# Patient Record
Sex: Male | Born: 1950 | Race: Black or African American | Hispanic: No | Marital: Married | Smoking: Current every day smoker
Health system: Southern US, Community
[De-identification: ages and names within clinical notes are randomized; demographics above are authoritative.]

## PROBLEM LIST (undated history)

## (undated) ENCOUNTER — Emergency Department (HOSPITAL_COMMUNITY): Admission: EM | Payer: Medicare Other

## (undated) DIAGNOSIS — F329 Major depressive disorder, single episode, unspecified: Secondary | ICD-10-CM

## (undated) DIAGNOSIS — F1911 Other psychoactive substance abuse, in remission: Secondary | ICD-10-CM

## (undated) DIAGNOSIS — R6250 Unspecified lack of expected normal physiological development in childhood: Secondary | ICD-10-CM

## (undated) DIAGNOSIS — F32A Depression, unspecified: Secondary | ICD-10-CM

## (undated) DIAGNOSIS — R32 Unspecified urinary incontinence: Secondary | ICD-10-CM

## (undated) DIAGNOSIS — F419 Anxiety disorder, unspecified: Secondary | ICD-10-CM

## (undated) DIAGNOSIS — W3400XA Accidental discharge from unspecified firearms or gun, initial encounter: Secondary | ICD-10-CM

## (undated) DIAGNOSIS — Y249XXA Unspecified firearm discharge, undetermined intent, initial encounter: Secondary | ICD-10-CM

## (undated) HISTORY — DX: Unspecified urinary incontinence: R32

## (undated) HISTORY — PX: LEG SURGERY: SHX1003

## (undated) HISTORY — DX: Unspecified firearm discharge, undetermined intent, initial encounter: Y24.9XXA

## (undated) HISTORY — PX: BACK SURGERY: SHX140

## (undated) HISTORY — DX: Accidental discharge from unspecified firearms or gun, initial encounter: W34.00XA

## (undated) HISTORY — PX: ABDOMINAL AORTIC ANEURYSM REPAIR: SUR1152

---

## 2005-10-09 ENCOUNTER — Other Ambulatory Visit: Payer: Self-pay

## 2005-10-09 ENCOUNTER — Emergency Department: Payer: Self-pay | Admitting: Emergency Medicine

## 2010-11-22 ENCOUNTER — Observation Stay: Payer: Self-pay | Admitting: Internal Medicine

## 2011-12-13 ENCOUNTER — Observation Stay: Payer: Self-pay | Admitting: Internal Medicine

## 2011-12-13 LAB — CBC WITH DIFFERENTIAL/PLATELET
Eosinophil #: 0.2 10*3/uL (ref 0.0–0.7)
Eosinophil %: 5.3 %
HGB: 13.5 g/dL (ref 13.0–18.0)
Lymphocyte %: 38.3 %
MCH: 31.2 pg (ref 26.0–34.0)
MCHC: 32.6 g/dL (ref 32.0–36.0)
Monocyte #: 0.5 x10 3/mm (ref 0.2–1.0)
Neutrophil %: 42.4 %
Platelet: 187 10*3/uL (ref 150–440)
RBC: 4.33 10*6/uL — ABNORMAL LOW (ref 4.40–5.90)
WBC: 4.1 10*3/uL (ref 3.8–10.6)

## 2011-12-13 LAB — COMPREHENSIVE METABOLIC PANEL
Albumin: 3.5 g/dL (ref 3.4–5.0)
Alkaline Phosphatase: 72 U/L (ref 50–136)
BUN: 9 mg/dL (ref 7–18)
Bilirubin,Total: 0.3 mg/dL (ref 0.2–1.0)
Co2: 26 mmol/L (ref 21–32)
Creatinine: 1.05 mg/dL (ref 0.60–1.30)
Glucose: 125 mg/dL — ABNORMAL HIGH (ref 65–99)
Osmolality: 287 (ref 275–301)
Potassium: 3.8 mmol/L (ref 3.5–5.1)
SGPT (ALT): 18 U/L (ref 12–78)
Sodium: 144 mmol/L (ref 136–145)

## 2011-12-13 LAB — TROPONIN I: Troponin-I: <0.02 ng/mL

## 2011-12-14 LAB — LIPID PANEL
HDL Cholesterol: 47 mg/dL (ref 40–60)
VLDL Cholesterol, Calc: 14 mg/dL (ref 5–40)

## 2011-12-14 LAB — TROPONIN I: Troponin-I: <0.02 ng/mL

## 2012-05-29 ENCOUNTER — Emergency Department: Payer: Self-pay | Admitting: Emergency Medicine

## 2012-05-29 LAB — COMPREHENSIVE METABOLIC PANEL
Albumin: 3.8 g/dL (ref 3.4–5.0)
Anion Gap: 4 — ABNORMAL LOW (ref 7–16)
Chloride: 106 mmol/L (ref 98–107)
Co2: 30 mmol/L (ref 21–32)
EGFR (African American): >60
EGFR (Non-African Amer.): >60
Potassium: 4.2 mmol/L (ref 3.5–5.1)
SGOT(AST): 30 U/L (ref 15–37)
SGPT (ALT): 26 U/L (ref 12–78)
Total Protein: 7.3 g/dL (ref 6.4–8.2)

## 2012-05-29 LAB — CK TOTAL AND CKMB (NOT AT ARMC)
CK, Total: 459 U/L — ABNORMAL HIGH (ref 35–232)
CK-MB: 8.4 ng/mL — ABNORMAL HIGH (ref 0.5–3.6)

## 2012-05-29 LAB — CBC
HCT: 42.6 % (ref 40.0–52.0)
HGB: 14.4 g/dL (ref 13.0–18.0)
MCHC: 33.7 g/dL (ref 32.0–36.0)
RBC: 4.45 10*6/uL (ref 4.40–5.90)
RDW: 14.2 % (ref 11.5–14.5)
WBC: 4.7 10*3/uL (ref 3.8–10.6)

## 2012-05-29 LAB — TROPONIN I
Troponin-I: <0.02 ng/mL
Troponin-I: <0.02 ng/mL

## 2014-07-20 NOTE — Discharge Summary (Signed)
PATIENT NAME:  Justin Stevens, Justin Stevens MR#:  [id number] DATE OF BIRTH:  Feb 26, 1951  DATE OF ADMISSION:  12/13/2011 DATE OF DISCHARGE:  12/14/2011  ADMITTING DIAGNOSIS: Chest pain.  DISCHARGE DIAGNOSES: 1. Chest pain of unclear etiology at this time, noncardiac likely. Negative cardiac enzymes for injury. Normal  Myoview. Likely chronic obstructive pulmonary disease related.  2. Tobacco abuse.  3. Elevated blood pressure, but no diagnosed hypertension, resolved, possibly stress related. 4. Hyperglycemia with hemoglobin A1c 5.4.   DISCHARGE CONDITION: Fair.   DISCHARGE MEDICATIONS:  1. The patient is to start nicotine oral inhaler 1 inhalation every hour as needed.  2. Combivent Respimat 1 puff 4 times daily.  DIET: Two-gram salt.   PHYSICAL ACTIVITY LIMITATIONS: As tolerated.   FOLLOWUP: Follow-up appointment with primary care physician two days after discharge.  If the patient does not have a primary care physician we will try to make an appointment with the [facility] at this point.   RADIOLOGIC STUDIES:  1. Chest PA and lateral 12/13/2011 showed no acute abnormality. 2. Myoview stress test 12/14/2011 revealed normal study with normal left ventricular systolic function, left ventricular ejection fraction of 69% with most likely noncardiac chest pain.   HISTORY OF PRESENT ILLNESS: The patient is a 64 year old African American male with  no significant past medical history who presented to the hospital with complaints of chest pain. Please refer to Dr. Lianne Moris  admission note on 12/13/2011.  On arrival to the hospital, the patient's temperature was 97.1, pulse was 73, respiration rate 18, blood pressure 160/73, saturation 98% on room air.  Physical exam was unremarkable.   LABORATORY DATA: The patient's lab data showed mild elevation of glucose to 125, otherwise BMP was unremarkable. The patient's liver enzymes were normal. Cardiac enzymes, first set as well as subsequent two more sets were  within normal limits. CBC within normal limits. The patient's EKG showed no acute ST-T changes.    HOSPITAL COURSE: The patient was admitted to the hospital to telemetry. His cardiac enzymes were cycled and Myoview stress test was performed. Myoview stress test was unremarkable. Cardiologist Dr. Neoma Laming felt that patient had noncardiac chest pain. Upon further evaluation on 12/14/2011, it appeared that the patient was having some wheezing as well as tight lung fields. He admitted to smoking and they felt that the patient very likely has underlying chronic obstructive pulmonary disease due to tobacco abuse. The patient was advised to quit smoking and that was discussed with him and he voiced understanding. The patient will be given nicotine replacement therapy upon discharge. He will also be given inhalation therapy with Combivent as an outpatient and DuoNebs will be given prior to discharge from the hospital.  On the day of discharge the patient feels satisfactory, does not complain of any significant pains. His vital signs are stable. Temperature is 97.5, pulse ranging from 48 to 55, respiratory rate 18, blood pressure 133/74, saturation was 99% on room air at rest. Of note, the patient was noted to be bradycardiac in the hospital. He received metoprolol on the day of admission, 12/13/2011.  However, he did not receive any metoprolol on 12/14/2011. It was unclear if the patient's bradycardia is related at all to prior mentioned medication; however, it is recommended to follow patient's heart rate as an outpatient and make sure that the patient's heart rate improves when he is discharged home. The patient will be ambulated now in the hospital and if his heart rate picks up with ambulation he will be  discharged home with primary care physician followup.   TIME SPENT: 40 minutes.   ____________________________ Theodoro Grist, MD rv:bjt D: 12/14/2011 16:38:01 ET T: 12/15/2011 10:34:27  ET JOB#: [id number]  cc: Theodoro Grist, MD, <Dictator> Koleman Marling MD ELECTRONICALLY SIGNED 12/23/2011 18:26

## 2014-07-20 NOTE — H&P (Signed)
PATIENT NAME:  Justin Stevens, Justin Stevens MR#:  [id number] DATE OF BIRTH:  1950-07-28  DATE OF ADMISSION:  12/13/2011  PRIMARY CARE PHYSICIAN: Nonlocal REFERRING PHYSICIAN: Dr. Corky Downs   CHIEF COMPLAINT: Chest pain today.   HISTORY OF PRESENT ILLNESS: 64 year old African American male with no past medical history presented to the ED with chest pain since 9:00 a.m. today which is in substernal area, constant, 7/10, no radiation. Patient came to ED for further evaluation and was treated with nitro and aspirin. Chest pain became better. Patient denies any fever, chills. No cough, sputum. No shortness of breath. No diaphoresis. No leg edema, orthopnea or nocturnal dyspnea.   PAST MEDICAL HISTORY: None.  FAMILY HISTORY: No hypertension, diabetes, heart attack, or stroke.   PAST SURGICAL HISTORY: None.  SOCIAL HISTORY: Occasionally smokes 1 to 2 cigarettes for many years. No alcohol drinking or illicit drugs.   ALLERGIES: None.   REVIEW OF SYSTEMS: CONSTITUTIONAL: Patient denies any fever, chills. No headache, dizziness, fever chills. No weakness. EYES: No double vision, blurred vision. ENT: No epistaxis, postnasal drip or slurred speech or dysphagia. CARDIOVASCULAR: Positive for chest pain but no palpitations, orthopnea, nocturnal dyspnea. No leg edema. PULMONARY: No cough, sputum, shortness of breath, or hemoptysis. GASTROINTESTINAL: No abdominal pain, nausea, vomiting, or diarrhea. No melena or bloody stool. GENITOURINARY: No dysuria, hematuria, or incontinence. SKIN: No rash or jaundice. NEUROLOGY: No syncope, loss of consciousness or seizure. PSYCH: No stress, depression. No anxiety.     PHYSICAL EXAMINATION:  VITALS: Temperature 97.1, blood pressure 160/73, pulse 73, respirations 18, oxygen saturation 98% on room air.   GENERAL: Patient is alert, awake, oriented in no acute distress.   HEENT: Pupils round, equal, reactive to light and accommodation. Moist oral mucosa. Clear oropharynx.  NECK: Supple.  No JVD or carotid bruit. No lymphadenopathy. No thyromegaly.    CARDIOVASCULAR: S1, S2 regular rate, rhythm. No murmurs, gallops.   PULMONARY: Bilateral air entry. No wheezing or rales. No use of accessory muscles to breathe.   ABDOMEN: Soft. No distention. No tenderness. No organomegaly. Bowel sounds present.   EXTREMITIES: No edema, clubbing, or cyanosis. No calf tenderness. Strong bilateral pedal pulses.   SKIN: No rash or jaundice.   NEUROLOGY: Alert and oriented x3. No focal deficit. Power 5/5. Sensation intact. Deep tendon reflexes 2+.   LABORATORY, DIAGNOSTIC, AND RADIOLOGICAL DATA: CBC normal. Glucose 125, BUN 9, creatinine 1.05. Electrolytes normal. Troponin less than 0.02. Chest x-ray no acute abnormality.   IMPRESSION:  1. Chest pain, need to rule out ACS and rule out coronary artery disease.  2. Hypertension.   PLAN OF TREATMENT:  1. Patient will be placed for observation. Will continue telemonitor. Will continue aspirin. Start Lopressor, lisinopril and Zocor.  2. Follow up troponin level for another two sets and check lipid panel, hemoglobin A1c.   3. Will get a stress test.  4. GI and deep vein thrombosis prophylaxis.   Discussed the patient's situation and plan of treatment with patient and the patient's wife.   TIME SPENT: About 50 minutes.   ____________________________ Demetrios Loll, MD qc:cms D: 12/13/2011 13:16:58 ET T: 12/13/2011 13:42:49 ET JOB#: [id number]  cc: Demetrios Loll, MD, <Dictator>  Demetrios Loll MD ELECTRONICALLY SIGNED 12/14/2011 18:25

## 2015-11-23 ENCOUNTER — Telehealth: Payer: Self-pay | Admitting: Urology

## 2015-11-23 ENCOUNTER — Ambulatory Visit (INDEPENDENT_AMBULATORY_CARE_PROVIDER_SITE_OTHER): Payer: Medicare Other | Admitting: Urology

## 2015-11-23 ENCOUNTER — Encounter: Payer: Self-pay | Admitting: Urology

## 2015-11-23 DIAGNOSIS — N3941 Urge incontinence: Secondary | ICD-10-CM

## 2015-11-23 DIAGNOSIS — N138 Other obstructive and reflux uropathy: Secondary | ICD-10-CM

## 2015-11-23 DIAGNOSIS — R32 Unspecified urinary incontinence: Secondary | ICD-10-CM

## 2015-11-23 DIAGNOSIS — N401 Enlarged prostate with lower urinary tract symptoms: Secondary | ICD-10-CM

## 2015-11-23 LAB — BLADDER SCAN AMB NON-IMAGING: Scan Result: 0
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
## 2015-11-23 NOTE — Telephone Encounter (Signed)
Please call the patient's caregiver and tell them to discontinue the ditropan.  The Myrbetriq samples are to be taken in its place, but Myrbetriq is once daily.

## 2015-11-24 LAB — PSA: Prostate Specific Ag, Serum: 0.7 ng/mL (ref 0.0–4.0)

## 2015-11-24 NOTE — Telephone Encounter (Signed)
Spoke with caregiver and orders for discontinuation of med order faxed also. Caregiver had initially called needing orders for the myrbetriq samples and pull ups. All three orders faxed.

## 2015-12-19 ENCOUNTER — Ambulatory Visit (INDEPENDENT_AMBULATORY_CARE_PROVIDER_SITE_OTHER): Payer: Medicare Other | Admitting: Urology

## 2015-12-19 ENCOUNTER — Encounter: Payer: Self-pay | Admitting: Urology

## 2015-12-19 VITALS — BP 134/68 | HR 57 | Ht 72.0 in | Wt 176.7 lb

## 2015-12-19 DIAGNOSIS — N400 Enlarged prostate without lower urinary tract symptoms: Secondary | ICD-10-CM

## 2015-12-19 DIAGNOSIS — N138 Other obstructive and reflux uropathy: Secondary | ICD-10-CM

## 2015-12-19 DIAGNOSIS — N401 Enlarged prostate with lower urinary tract symptoms: Secondary | ICD-10-CM | POA: Diagnosis not present

## 2015-12-19 DIAGNOSIS — N3941 Urge incontinence: Secondary | ICD-10-CM

## 2015-12-19 LAB — BLADDER SCAN AMB NON-IMAGING: Scan Result: 0

## 2015-12-19 MED ORDER — MIRABEGRON ER 25 MG PO TB24: 25.0000 mg | ORAL_TABLET | Freq: Every day | ORAL | 12 refills | Status: DC
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
## 2016-05-22 ENCOUNTER — Encounter: Payer: Self-pay | Admitting: Urology

## 2016-05-22 ENCOUNTER — Ambulatory Visit (INDEPENDENT_AMBULATORY_CARE_PROVIDER_SITE_OTHER): Payer: Medicare Other | Admitting: Urology

## 2016-05-22 VITALS — BP 131/74 | HR 78 | Ht 72.0 in | Wt 191.1 lb

## 2016-05-22 DIAGNOSIS — N3942 Incontinence without sensory awareness: Secondary | ICD-10-CM

## 2016-05-22 DIAGNOSIS — R3129 Other microscopic hematuria: Secondary | ICD-10-CM | POA: Diagnosis not present

## 2016-05-22 LAB — URINALYSIS, COMPLETE
BILIRUBIN UA: NEGATIVE
Glucose, UA: NEGATIVE
KETONES UA: NEGATIVE
NITRITE UA: NEGATIVE
PH UA: 6.5 (ref 5.0–7.5)
Protein, UA: NEGATIVE
SPEC GRAV UA: 1.02 (ref 1.005–1.030)
Urobilinogen, Ur: 0.2 mg/dL (ref 0.2–1.0)

## 2016-05-22 LAB — MICROSCOPIC EXAMINATION: Epithelial Cells (non renal): NONE SEEN /hpf (ref 0–10)

## 2016-05-23 LAB — BUN+CREAT
BUN/Creatinine Ratio: 16 (ref 10–24)
BUN: 15 mg/dL (ref 8–27)
Creatinine, Ser: 0.95 mg/dL (ref 0.76–1.27)
GFR, EST AFRICAN AMERICAN: 96 (ref >59)
GFR, EST NON AFRICAN AMERICAN: 83 (ref >59)

## 2016-05-25 LAB — CULTURE, URINE COMPREHENSIVE

## 2016-06-08 ENCOUNTER — Ambulatory Visit
Admission: RE | Admit: 2016-06-08 | Discharge: 2016-06-08 | Disposition: A | Discharge status: Home / Self Care | Payer: Medicare Other | Source: Ambulatory Visit | Attending: Urology | Admitting: Urology

## 2016-06-08 DIAGNOSIS — N323 Diverticulum of bladder: Secondary | ICD-10-CM | POA: Diagnosis not present

## 2016-06-08 DIAGNOSIS — R3129 Other microscopic hematuria: Secondary | ICD-10-CM | POA: Diagnosis present

## 2016-06-08 MED ORDER — IOPAMIDOL (ISOVUE-300) INJECTION 61%
125.0000 mL | Freq: Once | INTRAVENOUS | Status: AC | PRN
  Administered 2016-06-08: 125 mL via INTRAVENOUS

## 2016-06-13 ENCOUNTER — Ambulatory Visit (INDEPENDENT_AMBULATORY_CARE_PROVIDER_SITE_OTHER): Payer: Medicare Other | Admitting: Urology

## 2016-06-13 ENCOUNTER — Encounter: Payer: Self-pay | Admitting: Urology

## 2016-06-13 ENCOUNTER — Other Ambulatory Visit: Payer: Self-pay | Admitting: Radiology

## 2016-06-13 VITALS — BP 138/77 | HR 73 | Ht 72.0 in | Wt 187.4 lb

## 2016-06-13 DIAGNOSIS — R3129 Other microscopic hematuria: Secondary | ICD-10-CM

## 2016-06-13 DIAGNOSIS — R310 Gross hematuria: Secondary | ICD-10-CM

## 2016-06-13 LAB — URINALYSIS, COMPLETE
Bilirubin, UA: NEGATIVE
Glucose, UA: NEGATIVE
Ketones, UA: NEGATIVE
Nitrite, UA: NEGATIVE
Protein, UA: NEGATIVE
Specific Gravity, UA: 1.015 (ref 1.005–1.030)
Urobilinogen, Ur: 1 mg/dL (ref 0.2–1.0)
pH, UA: 6 (ref 5.0–7.5)

## 2016-06-13 LAB — MICROSCOPIC EXAMINATION: RBC, UA: NONE SEEN /hpf (ref 0–?)

## 2016-06-13 MED ORDER — LIDOCAINE HCL 2 % EX GEL
1.0000 "application " | Freq: Once | CUTANEOUS | Status: AC
  Administered 2016-06-13: 1 via URETHRAL

## 2016-06-13 MED ORDER — CIPROFLOXACIN HCL 500 MG PO TABS
500.0000 mg | ORAL_TABLET | Freq: Once | ORAL | Status: AC
  Administered 2016-06-13: 500 mg via ORAL

## 2016-06-13 NOTE — Progress Notes (Signed)
   06/13/16  CC:  Chief Complaint  Patient presents with  . Cysto    possible stricture,BOO    HPI: F/u microscopic and gross hematuria for cystoscopy. CT 06/08/2016 benign apart from a benign a small 17 mm right lateral diverticulum. Pt has noted red urine on occasion.   H/o urgency, frequency on Myrbetriq. Recent DRE nl. PSA low.   Blood pressure 138/77, pulse 73, height 6' (1.829 m), weight 85 kg (187 lb 6.4 oz). NED. A&Ox3.   No respiratory distress   Abd soft, NT, ND Normal phallus with bilateral descended testicles  Cystoscopy Procedure Note  Patient identification was confirmed, informed consent was obtained, and patient was prepped using Betadine solution.  Lidocaine jelly was administered per urethral meatus.    Preoperative abx where received prior to procedure.     Pre-Procedure: - Inspection reveals a normal caliber ureteral meatus.  Procedure: The flexible cystoscope was introduced without difficulty - large caliber bulb urethral stricture, I was able to negotiate scope - normal prostate  - normal bladder neck - Bilateral ureteral orifices identified - Bladder mucosa revealed several posterior conspicuous erythematous areas some almost appearing early papillary. Right tic looked OK.  - No bladder stones - No trabeculation  Retroflexion shows normal bladder neck.    Post-Procedure: - Patient tolerated the procedure well  Assessment/ Plan: -microhematuria with irritative voiding symptoms and areas of posterior bladder erythema.  -send urine cytology -discussed with patient and caregiver nature r/b/a to cysto/bbx, TURBT. Discussed bleeding, infection, bladder perforation among others. Need foe foley post-op. Discussed one of my colleagues would be doing the procedure. All questions answered. They elect to proceed.

## 2016-06-14 ENCOUNTER — Telehealth: Payer: Self-pay

## 2016-06-14 NOTE — Telephone Encounter (Signed)
Spoke with patient's home caretaker Dewaine Oats and gave her patient's surgery date of 06-25-16 and explained they would need to call the Friday before between 1-3 to get the arrival time. She was also told that patient's pre-op apt is scheduled for 06[facility]18-16@1pm  [facility] and suite number was also given. Dewaine Oats verbalized understanding and read back instructions and dates.

## 2016-06-18 ENCOUNTER — Inpatient Hospital Stay: Admission: RE | Admit: 2016-06-18 | Payer: Medicare Other | Source: Ambulatory Visit

## 2016-06-18 ENCOUNTER — Other Ambulatory Visit: Payer: Self-pay | Admitting: Urology

## 2016-06-19 ENCOUNTER — Encounter
Admission: RE | Admit: 2016-06-19 | Discharge: 2016-06-19 | Disposition: A | Discharge status: Home / Self Care | Payer: Medicare Other | Source: Ambulatory Visit | Attending: Urology | Admitting: Urology

## 2016-06-19 DIAGNOSIS — Z01818 Encounter for other preprocedural examination: Secondary | ICD-10-CM | POA: Diagnosis not present

## 2016-06-19 DIAGNOSIS — Z01810 Encounter for preprocedural cardiovascular examination: Secondary | ICD-10-CM | POA: Diagnosis present

## 2016-06-19 HISTORY — DX: Depression, unspecified: F32.A

## 2016-06-19 HISTORY — DX: Unspecified lack of expected normal physiological development in childhood: R62.50

## 2016-06-19 HISTORY — DX: Unspecified urinary incontinence: R32

## 2016-06-19 HISTORY — DX: Major depressive disorder, single episode, unspecified: F32.9

## 2016-06-19 HISTORY — DX: Anxiety disorder, unspecified: F41.9

## 2016-06-19 NOTE — OR Nursing (Signed)
Pharmacy consulted [id number] of ancef and order put in [facility]

## 2016-06-19 NOTE — Patient Instructions (Signed)
Your procedure is scheduled on: June 25, 2016 TUESDAY Su procedimiento est programado para: Report to Max a: To find out your arrival time please call [phone] between 1PM - 3PM on Monday June 24, 2016 Para saber su hora de llegada por favor llame al ([place]:  Remember: Instructions that are not followed completely may result in serious medical risk, up to and including death, or upon the discretion of your surgeon and anesthesiologist your surgery may need to be rescheduled.  Recuerde: Las instrucciones que no se siguen completamente Heritage manager en un riesgo de salud grave, incluyendo hasta la [place] o a discrecin de su cirujano y Environmental health practitioner, su ciruga se puede posponer.   __X__ 1. Do not eat food or drink liquids after midnight. No gum chewing or hard candies.  No coma alimentos ni tome lquidos despus de la medianoche.  No mastique chicle ni caramelos  duros.     __X__ 2. No alcohol for 24 hours before or after surgery.    No tome alcohol durante las 24 horas antes ni despus de la [place].   __X__ 3. Bring all medications with you on the day of surgery if instructed. BRING ANY NEW MEDICATIONS    Lleve todos los medicamentos con usted el da de su ciruga si se le ha indicado as.   ___X_ 4. Notify your doctor if there is any change in your medical condition (cold, fever,                             infections).    Informe a su mdico si hay algn cambio en su condicin mdica (resfriado, fiebre, infecciones).   Do not wear jewelry, make-up, hairpins, clips or nail polish.  No use joyas, maquillajes, pinzas/ganchos para el cabello ni esmalte de uas.  Do not wear lotions, powders, or perfumes. You may wear deodorant.  No use lociones, polvos o perfumes.  Puede usar desodorante.    Do not shave 48 hours prior to surgery. Men may shave face and neck.  No se afeite 48 horas antes  de la [place].  Los hombres pueden [employer] cara y el cuello.   Do not bring valuables to the hospital.   No lleve objetos [facility] is not responsible for any belongings or valuables.  [place] no se hace responsable de ningn tipo de pertenencias u objetos de Geographical information systems officer.               Contacts, dentures or bridgework may not be worn into surgery.  Los lentes de [place], las dentaduras postizas o puentes no se pueden usar en la [place].  Leave your suitcase in the car. After surgery it may be brought to your room.  Deje su maleta en el auto.  Despus de la ciruga podr traerla a su habitacin.  For patients admitted to the hospital, discharge time is determined by your treatment team.  Para los pacientes que sean ingresados al hospital, el tiempo en el cual se le dar de alta es determinado por su                equipo de [place].   Patients discharged the day of surgery will not be allowed to drive home. A los pacientes que se les da de alta el mismo da de la ciruga no se les  permitir conducir a casa.   Please read over the following fact sheets that you were given: Por favor [place] informacin que l   __X__ Take these medicines the morning of surgery with A SIP OF WATER:          [employer] estas medicinas la maana de la ciruga con UN SORBO DE AGUA:  1.ATIVAN   2. LOXAPINE  3.   4.       5.  6.  ____ Fleet Enema (as directed)          Enema de Fleet (segn lo indicado)    ____ Use CHG Soap as directed          Utilice el jabn de CHG segn lo indicado  ____ Use inhalers on the day of surgery          Use los inhaladores el da de la ciruga  ____ Stop metformin 2 days prior to surgery          Deje de tomar el metformin 2 das antes de la ciruga    ____ Take 1/2 of usual insulin dose the night before surgery and none on the morning of surgery           Tome la mitad de la dosis habitual de insulina la noche antes de la  [place] y no tome nada en la maana de la             ciruga  ____ Stop Coumadin/Plavix/aspirin on           Deje de tomar el Coumadin/Plavix/aspirina el da:  __X_ Stop Anti-inflammatories on ADVIL, IBUPROFEN, ALEVE, MOTRIN, NAPROXEN, USE  ONLY TYLENOL IF NEEDED FOR PAIN          Deje de tomar antiinflamatorios el da:   ____ Stop supplements until after surgery            Deje de tomar suplementos hasta despus de la ciruga  ____ Bring C-Pap to the hospital          [place] al hospital

## 2016-06-24 MED ORDER — CEFAZOLIN SODIUM-DEXTROSE 2-4 GM/100ML-% IV SOLN
2.0000 g | Freq: Once | INTRAVENOUS | Status: AC
  Administered 2016-06-25: 2 g via INTRAVENOUS

## 2016-06-25 ENCOUNTER — Encounter: Payer: Self-pay | Admitting: *Deleted

## 2016-06-25 ENCOUNTER — Ambulatory Visit: Payer: Medicare Other | Admitting: Anesthesiology

## 2016-06-25 ENCOUNTER — Ambulatory Visit
Admission: RE | Admit: 2016-06-25 | Discharge: 2016-06-25 | Disposition: A | Discharge status: Home / Self Care | Payer: Medicare Other | Source: Ambulatory Visit | Attending: Urology | Admitting: Urology

## 2016-06-25 ENCOUNTER — Encounter
Admission: RE | Disposition: A | Discharge status: Home / Self Care | Payer: Self-pay | Source: Ambulatory Visit | Attending: Urology

## 2016-06-25 DIAGNOSIS — R311 Benign essential microscopic hematuria: Secondary | ICD-10-CM | POA: Diagnosis not present

## 2016-06-25 DIAGNOSIS — N3941 Urge incontinence: Secondary | ICD-10-CM | POA: Diagnosis not present

## 2016-06-25 DIAGNOSIS — R6250 Unspecified lack of expected normal physiological development in childhood: Secondary | ICD-10-CM | POA: Diagnosis not present

## 2016-06-25 DIAGNOSIS — N3091 Cystitis, unspecified with hematuria: Secondary | ICD-10-CM | POA: Diagnosis not present

## 2016-06-25 DIAGNOSIS — D494 Neoplasm of unspecified behavior of bladder: Secondary | ICD-10-CM | POA: Diagnosis not present

## 2016-06-25 DIAGNOSIS — Z79899 Other long term (current) drug therapy: Secondary | ICD-10-CM | POA: Insufficient documentation

## 2016-06-25 DIAGNOSIS — N3289 Other specified disorders of bladder: Secondary | ICD-10-CM | POA: Insufficient documentation

## 2016-06-25 DIAGNOSIS — F17200 Nicotine dependence, unspecified, uncomplicated: Secondary | ICD-10-CM | POA: Insufficient documentation

## 2016-06-25 DIAGNOSIS — F418 Other specified anxiety disorders: Secondary | ICD-10-CM | POA: Insufficient documentation

## 2016-06-25 DIAGNOSIS — R3129 Other microscopic hematuria: Secondary | ICD-10-CM | POA: Diagnosis present

## 2016-06-25 HISTORY — PX: CYSTOSCOPY WITH BIOPSY: SHX5122

## 2016-06-25 HISTORY — PX: CYSTOSCOPY WITH FULGERATION: SHX6638

## 2016-06-25 LAB — URINE DRUG SCREEN, QUALITATIVE (ARMC ONLY)
Amphetamines, Ur Screen: NOT DETECTED
BARBITURATES, UR SCREEN: NOT DETECTED
BENZODIAZEPINE, UR SCRN: NOT DETECTED
CANNABINOID 50 NG, UR ~~LOC~~: NOT DETECTED
Cocaine Metabolite,Ur ~~LOC~~: NOT DETECTED
MDMA (Ecstasy)Ur Screen: NOT DETECTED
METHADONE SCREEN, URINE: NOT DETECTED
OPIATE, UR SCREEN: NOT DETECTED
PHENCYCLIDINE (PCP) UR S: NOT DETECTED
Tricyclic, Ur Screen: NOT DETECTED

## 2016-06-25 SURGERY — CYSTOSCOPY, WITH BIOPSY
Anesthesia: General | Wound class: Clean Contaminated

## 2016-06-25 MED ORDER — HYDROCODONE-ACETAMINOPHEN 5-325 MG PO TABS
1.0000 | ORAL_TABLET | Freq: Once | ORAL | Status: AC
  Administered 2016-06-25: 1 via ORAL

## 2016-06-25 MED ORDER — FAMOTIDINE 20 MG PO TABS
20.0000 mg | ORAL_TABLET | Freq: Once | ORAL | Status: AC
  Administered 2016-06-25: 20 mg via ORAL

## 2016-06-25 MED ORDER — LACTATED RINGERS IV SOLN
INTRAVENOUS | Status: DC
  Administered 2016-06-25: 09:00:00 via INTRAVENOUS

## 2016-06-25 MED ORDER — PROPOFOL 10 MG/ML IV BOLUS
INTRAVENOUS | Status: DC | PRN
Start: 2016-06-25 — End: 2016-06-25
  Administered 2016-06-25: 100 mg via INTRAVENOUS

## 2016-06-25 MED ORDER — ONDANSETRON HCL 4 MG/2ML IJ SOLN
INTRAMUSCULAR | Status: AC
Start: 2016-06-25 — End: 2016-06-25
  Filled 2016-06-25: qty 2

## 2016-06-25 MED ORDER — LACTATED RINGERS IV SOLN
INTRAVENOUS | Status: DC | PRN
  Administered 2016-06-25: 09:00:00 via INTRAVENOUS

## 2016-06-25 MED ORDER — ONDANSETRON HCL 4 MG/2ML IJ SOLN: 4.0000 mg | Freq: Once | INTRAMUSCULAR | Status: DC | PRN

## 2016-06-25 MED ORDER — MIDAZOLAM HCL 2 MG/2ML IJ SOLN
INTRAMUSCULAR | Status: AC
  Filled 2016-06-25: qty 2

## 2016-06-25 MED ORDER — FENTANYL CITRATE (PF) 100 MCG/2ML IJ SOLN
INTRAMUSCULAR | Status: DC | PRN
  Administered 2016-06-25: 25 ug via INTRAVENOUS
  Administered 2016-06-25: 75 ug via INTRAVENOUS

## 2016-06-25 MED ORDER — FENTANYL CITRATE (PF) 100 MCG/2ML IJ SOLN: 25.0000 ug | INTRAMUSCULAR | Status: DC | PRN

## 2016-06-25 MED ORDER — DEXAMETHASONE SODIUM PHOSPHATE 10 MG/ML IJ SOLN
INTRAMUSCULAR | Status: AC
  Filled 2016-06-25: qty 1

## 2016-06-25 MED ORDER — DEXAMETHASONE SODIUM PHOSPHATE 10 MG/ML IJ SOLN
INTRAMUSCULAR | Status: DC | PRN
  Administered 2016-06-25: 5 mg via INTRAVENOUS

## 2016-06-25 MED ORDER — HYDROCODONE-ACETAMINOPHEN 5-325 MG PO TABS: 1.0000 | ORAL_TABLET | Freq: Four times a day (QID) | ORAL | 0 refills | Status: DC | PRN

## 2016-06-25 MED ORDER — ROCURONIUM BROMIDE 50 MG/5ML IV SOLN
INTRAVENOUS | Status: AC
  Filled 2016-06-25: qty 1

## 2016-06-25 MED ORDER — LIDOCAINE HCL (CARDIAC) 20 MG/ML IV SOLN
INTRAVENOUS | Status: DC | PRN
  Administered 2016-06-25: 25 mg via INTRAVENOUS

## 2016-06-25 MED ORDER — LIDOCAINE HCL (PF) 2 % IJ SOLN
INTRAMUSCULAR | Status: AC
  Filled 2016-06-25: qty 2

## 2016-06-25 MED ORDER — PROPOFOL 10 MG/ML IV BOLUS
INTRAVENOUS | Status: AC
  Filled 2016-06-25: qty 20

## 2016-06-25 MED ORDER — SUGAMMADEX SODIUM 200 MG/2ML IV SOLN
INTRAVENOUS | Status: AC
  Filled 2016-06-25: qty 2

## 2016-06-25 MED ORDER — EPHEDRINE SULFATE 50 MG/ML IJ SOLN
INTRAMUSCULAR | Status: DC | PRN
  Administered 2016-06-25: 10 mg via INTRAVENOUS

## 2016-06-25 MED ORDER — HYDROCODONE-ACETAMINOPHEN 5-325 MG PO TABS
ORAL_TABLET | ORAL | Status: AC
  Administered 2016-06-25: 1 via ORAL
  Filled 2016-06-25: qty 1

## 2016-06-25 MED ORDER — FENTANYL CITRATE (PF) 100 MCG/2ML IJ SOLN
INTRAMUSCULAR | Status: AC
  Filled 2016-06-25: qty 2

## 2016-06-25 MED ORDER — ONDANSETRON HCL 4 MG/2ML IJ SOLN
INTRAMUSCULAR | Status: DC | PRN
  Administered 2016-06-25: 4 mg via INTRAVENOUS

## 2016-06-25 SURGICAL SUPPLY — 35 items
BAG DRAIN CYSTO-URO LG1000N (MISCELLANEOUS) IMPLANT
BAG URO DRAIN 2000ML W/SPOUT (MISCELLANEOUS) IMPLANT
CATH FOLEY 2WAY  5CC 16FR (CATHETERS)
CATH URTH 16FR FL 2W BLN LF (CATHETERS) IMPLANT
DRAPE UTILITY 15X26 TOWEL STRL (DRAPES) ×3 IMPLANT
DRSG TELFA 4X3 1S NADH ST (GAUZE/BANDAGES/DRESSINGS) ×3 IMPLANT
ELECT LOOP 22F BIPOLAR SML (ELECTROSURGICAL)
ELECT REM PT RETURN 9FT ADLT (ELECTROSURGICAL) ×3
ELECTRODE LOOP 22F BIPOLAR SML (ELECTROSURGICAL) IMPLANT
ELECTRODE REM PT RTRN 9FT ADLT (ELECTROSURGICAL) ×1 IMPLANT
GLOVE BIO SURGEON STRL SZ 6.5 (GLOVE) ×2 IMPLANT
GLOVE BIO SURGEONS STRL SZ 6.5 (GLOVE) ×1
GLOVE BIOGEL M 6.5 STRL (GLOVE) ×3 IMPLANT
GOWN STRL REUS W/ TWL LRG LVL3 (GOWN DISPOSABLE) ×2 IMPLANT
GOWN STRL REUS W/TWL LRG LVL3 (GOWN DISPOSABLE) ×4
INTRODUCER DILATOR DOUBLE (INTRODUCER) IMPLANT
KIT RM TURNOVER CYSTO AR (KITS) ×3 IMPLANT
LOOP CUT BIPOLAR 24F LRG (ELECTROSURGICAL) IMPLANT
NDL SAFETY ECLIPSE 18X1.5 (NEEDLE) IMPLANT
NEEDLE HYPO 18GX1.5 SHARP (NEEDLE)
PACK CYSTO AR (MISCELLANEOUS) ×3 IMPLANT
PAD GROUND ADULT SPLIT (MISCELLANEOUS) IMPLANT
SCRUB POVIDONE IODINE 4 OZ (MISCELLANEOUS) ×3 IMPLANT
SENSORWIRE 0.038 NOT ANGLED (WIRE)
SET CYSTO W/LG BORE CLAMP LF (SET/KITS/TRAYS/PACK) ×3 IMPLANT
SET IRRIG Y TYPE TUR BLADDER L (SET/KITS/TRAYS/PACK) ×3 IMPLANT
SET IRRIGATING DISP (SET/KITS/TRAYS/PACK) ×3 IMPLANT
SHEATH URETERAL 12FRX35CM (MISCELLANEOUS) IMPLANT
SOL .9 NS 3000ML IRR  AL (IV SOLUTION) ×2
SOL .9 NS 3000ML IRR UROMATIC (IV SOLUTION) ×1 IMPLANT
SURGILUBE 2OZ TUBE FLIPTOP (MISCELLANEOUS) ×3 IMPLANT
SYRINGE IRR TOOMEY STRL 70CC (SYRINGE) ×3 IMPLANT
WATER STERILE IRR 1000ML POUR (IV SOLUTION) ×3 IMPLANT
WATER STERILE IRR 3000ML UROMA (IV SOLUTION) ×3 IMPLANT
WIRE SENSOR 0.038 NOT ANGLED (WIRE) IMPLANT
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
## 2016-06-25 NOTE — Op Note (Signed)
Date of procedure: 06/25/16  Preoperative diagnosis:  1. Bladder erythema 2. Microscopic hematuria   Postoperative diagnosis:  1. Same as above   Procedure: 1. Cystoscopy 2. Bladder biopsy 3. Fulguration of bladder erythema  Surgeon: Hollice Espy, MD  Anesthesia: General  Complications: None  Intraoperative findings: Patchy nonspecific posterior bladder wall erythema. Moderately trabeculated bladder with 2 right posterior lateral wall diverticula. Mildly elevated bladder neck.  EBL: Minimal  Specimens: Bladder biopsy (posterior wall erythema)  Drains: None  Indication: Justin Stevens is a 66 y.o. patient with with microscopic hematuria, irritative voiding symptoms found to have posterior bladder wall erythema on office cystoscopy. Urine cytology was negative.  After reviewing the management options for treatment, he elected to proceed with the above surgical procedure(s). We have discussed the potential benefits and risks of the procedure, side effects of the proposed treatment, the likelihood of the patient achieving the goals of the procedure, and any potential problems that might occur during the procedure or recuperation. Informed consent has been obtained.  Description of procedure:  The patient was taken to the operating room and general anesthesia was induced.  The patient was placed in the dorsal lithotomy position, prepped and draped in the usual sterile fashion, and preoperative antibiotics were administered. A preoperative time-out was performed.   A 21 French scope was advanced per urethra into the bladder.  Of note, there was a mild bulbar urethral stricture which occurred no additional dilation was easily passable by the 21 French cystoscope. Scope was then advanced into the bladder. Of note, there was some friable prostatic mucosa and a mildly elevated bladder neck. Inspection of his bladder revealed a moderately trabeculated bladder with 2 small diverticula on the  right posterior lateral wall. Trigone was normal with bilateral UOs visualized. There are some nonspecific patchy bladder erythema measuring approximate 1 cm in each of 3 or so patches of unclear etiology. There are no papillary bladder tumors appreciated. Cook cup biopsy forceps were then used to biopsy each of these erythematous areas. Total 5 or so biopsies were taken.  These were passed off the field as posterior wall bladder erythema. Using water as the medium, Bugbee electrocautery was used to copiously fulgurate the erythema for both hemostatic and therapeutic purposes. Hemostasis was satisfactory upon completion. The scope was then removed and the bladder was then drained. The patient was then repositioned the supine position, reversed anesthesia, taken to the PACU in stable condition. There are no complications in this case.  Plan: Patient will follow-up in 1 week for pathology results review.  Hollice Espy, M.D.

## 2016-06-25 NOTE — Anesthesia Procedure Notes (Signed)
Procedure Name: LMA Insertion Date/Time: 06/25/2016 9:27 AM Performed by: Darlyne Russian Pre-anesthesia Checklist: Patient identified, Suction available, Emergency Drugs available, Patient being monitored and Timeout performed Patient Re-evaluated:Patient Re-evaluated prior to inductionOxygen Delivery Method: Circle system utilized Preoxygenation: Pre-oxygenation with 100% oxygen Intubation Type: IV induction Ventilation: Mask ventilation without difficulty LMA: LMA inserted LMA Size: 4.0 Number of attempts: 1 Placement Confirmation: positive ETCO2 Tube secured with: Tape Dental Injury: Teeth and Oropharynx as per pre-operative assessment

## 2016-06-25 NOTE — Discharge Instructions (Addendum)
Transurethral Resection of Bladder Tumor (TURBT) or Bladder Biopsy ° ° °Definition: ° Transurethral Resection of the Bladder Tumor is a surgical procedure used to diagnose and remove tumors within the bladder. TURBT is the most common treatment for early stage bladder cancer. ° °General instructions: °   ° Your recent bladder surgery requires very little post hospital care but some definite precautions. ° °Despite the fact that no skin incisions were used, the area around the bladder incisions are raw and covered with scabs to promote healing and prevent bleeding. Certain precautions are needed to insure that the scabs are not disturbed over the next 2-4 weeks while the healing proceeds. ° °Because the raw surface inside your bladder and the irritating effects of urine you may expect frequency of urination and/or urgency (a stronger desire to urinate) and perhaps even getting up at night more often. This will usually resolve or improve slowly over the healing period. You may see some blood in your urine over the first 6 weeks. Do not be alarmed, even if the urine was clear for a while. Get off your feet and drink lots of fluids until clearing occurs. If you start to pass clots or don't improve call us. ° °Diet: ° °You may return to your normal diet immediately. Because of the raw surface of your bladder, alcohol, spicy foods, foods high in acid and drinks with caffeine may cause irritation or frequency and should be used in moderation. To keep your urine flowing freely and avoid constipation, drink plenty of fluids during the day (8-10 glasses). Tip: Avoid cranberry juice because it is very acidic. ° °Activity: ° °Your physical activity doesn't need to be restricted. However, if you are very active, you may see some blood in the urine. We suggest that you reduce your activity under the circumstances until the bleeding has stopped. ° °Bowels: ° °It is important to keep your bowels regular during the postoperative  period. Straining with bowel movements can cause bleeding. A bowel movement every other day is reasonable. Use a mild laxative if needed, such as milk of magnesia 2-3 tablespoons, or 2 Dulcolax tablets. Call if you continue to have problems. If you had been taking narcotics for pain, before, during or after your surgery, you may be constipated. Take a laxative if necessary. ° ° ° °Medication: ° °You should resume your pre-surgery medications unless told not to. In addition you may be given an antibiotic to prevent or treat infection. Antibiotics are not always necessary. All medication should be taken as prescribed until the bottles are finished unless you are having an unusual reaction to one of the drugs. ° ° °[facility] °[address], [address] °[place], [place] [address] °[phone] ° ° °AMBULATORY SURGERY  °DISCHARGE INSTRUCTIONS ° ° °1) The drugs that you were given will stay in your system until tomorrow so for the next 24 hours you should not: ° °A) Drive an automobile °B) Make any legal decisions °C) Drink any alcoholic beverage ° ° °2) You may resume regular meals tomorrow.  Today it is better to start with liquids and gradually work up to solid foods. ° °You may eat anything you prefer, but it is better to start with liquids, then soup and crackers, and gradually work up to solid foods. ° ° °3) Please notify your doctor immediately if you have any unusual bleeding, trouble breathing, redness and pain at the surgery site, drainage, fever, or pain not relieved by medication. ° ° ° °4)   Additional Instructions: ° ° ° ° ° ° ° °Please contact your physician with any problems or Same Day Surgery at [phone], Monday through Friday 6 am to 4 pm, or [place] [facility] [place] [facility] number at [phone]. ° ° ° ° °

## 2016-06-25 NOTE — Anesthesia Post-op Follow-up Note (Cosign Needed)
Anesthesia QCDR form completed.        

## 2016-06-25 NOTE — Anesthesia Postprocedure Evaluation (Signed)
Anesthesia Post Note  Patient: Justin Stevens  Procedure(s) Performed: Procedure(s) (LRB): CYSTOSCOPY WITH BIOPSY (N/A) CYSTOSCOPY WITH FULGERATION (N/A)  Patient location during evaluation: PACU Anesthesia Type: General Level of consciousness: awake and alert Pain management: pain level controlled Vital Signs Assessment: post-procedure vital signs reviewed and stable Respiratory status: spontaneous breathing, nonlabored ventilation, respiratory function stable and patient connected to nasal cannula oxygen Cardiovascular status: blood pressure returned to baseline and stable Postop Assessment: no signs of nausea or vomiting Anesthetic complications: no     Last Vitals:  Vitals:   06/25/16 1122 06/25/16 1204  BP: (!) 136/103 (!) 140/91  Pulse: (!) 55 62  Resp: 16 16  Temp: 36.3 C     Last Pain:  Vitals:   06/25/16 1204  TempSrc:   PainSc: 1                  Martha Clan

## 2016-06-25 NOTE — Transfer of Care (Addendum)
Immediate Anesthesia Transfer of Care Note  Patient: Justin Stevens  Procedure(s) Performed: Procedure(s): CYSTOSCOPY WITH BIOPSY (N/A) CYSTOSCOPY WITH FULGERATION (N/A)  Patient Location: PACU  Anesthesia Type:General  Level of Consciousness: awake, patient cooperative and responds to stimulation  Airway & Oxygen Therapy: Patient Spontanous Breathing and Patient connected to face mask oxygen  Post-op Assessment: Report given to RN and Post -op Vital signs reviewed and stable  Post vital signs: Reviewed and stable  Last Vitals:  Vitals:   06/25/16 1014 06/25/16 1020  BP: (!) 151/83   Pulse: (!) 55 60  Resp: 11 16  Temp: 36.6 C     Last Pain:  Vitals:   06/25/16 1014  TempSrc: Temporal  PainSc: 0-No pain         Complications: No apparent anesthesia complications, oral airway remains in place but responds easily to verbal stimuli.

## 2016-06-25 NOTE — Interval H&P Note (Signed)
History and Physical Interval Note:  06/25/2016 9:11 AM  Justin Stevens  has presented today for surgery, with the diagnosis of microscopic hematuria,bladder erythema  The various methods of treatment have been discussed with the patient and family. After consideration of risks, benefits and other options for treatment, the patient has consented to  Procedure(s): CYSTOSCOPY WITH BIOPSY (N/A) CYSTOSCOPY WITH FULGERATION (N/A) TRANSURETHRAL RESECTION OF BLADDER TUMOR (TURBT) (N/A) as a surgical intervention .  The patient's history has been reviewed, patient examined, no change in status, stable for surgery.  I have reviewed the patient's chart and labs.  Questions were answered to the patient's satisfaction.    RRR CTAB  Hollice Espy

## 2016-06-25 NOTE — H&P (View-Only) (Signed)
   06/13/16  CC:  Chief Complaint  Patient presents with  . Cysto    possible stricture,BOO    HPI: F/u microscopic and gross hematuria for cystoscopy. CT 06/08/2016 benign apart from a benign a small 17 mm right lateral diverticulum. Pt has noted red urine on occasion.   H/o urgency, frequency on Myrbetriq. Recent DRE nl. PSA low.   Blood pressure 138/77, pulse 73, height 6' (1.829 m), weight 85 kg (187 lb 6.4 oz). NED. A&Ox3.   No respiratory distress   Abd soft, NT, ND Normal phallus with bilateral descended testicles  Cystoscopy Procedure Note  Patient identification was confirmed, informed consent was obtained, and patient was prepped using Betadine solution.  Lidocaine jelly was administered per urethral meatus.    Preoperative abx where received prior to procedure.     Pre-Procedure: - Inspection reveals a normal caliber ureteral meatus.  Procedure: The flexible cystoscope was introduced without difficulty - large caliber bulb urethral stricture, I was able to negotiate scope - normal prostate  - normal bladder neck - Bilateral ureteral orifices identified - Bladder mucosa revealed several posterior conspicuous erythematous areas some almost appearing early papillary. Right tic looked OK.  - No bladder stones - No trabeculation  Retroflexion shows normal bladder neck.    Post-Procedure: - Patient tolerated the procedure well  Assessment/ Plan: -microhematuria with irritative voiding symptoms and areas of posterior bladder erythema.  -send urine cytology -discussed with patient and caregiver nature r/b/a to cysto/bbx, TURBT. Discussed bleeding, infection, bladder perforation among others. Need foe foley post-op. Discussed one of my colleagues would be doing the procedure. All questions answered. They elect to proceed.

## 2016-06-26 ENCOUNTER — Other Ambulatory Visit: Payer: Self-pay | Admitting: Urology

## 2016-06-26 ENCOUNTER — Encounter: Payer: Self-pay | Admitting: Urology

## 2016-06-27 LAB — SURGICAL PATHOLOGY

## 2016-07-04 ENCOUNTER — Ambulatory Visit: Payer: Medicare Other | Admitting: Urology

## 2016-07-04 ENCOUNTER — Encounter: Payer: Self-pay | Admitting: Urology

## 2016-07-11 ENCOUNTER — Telehealth: Payer: Self-pay | Admitting: Urology

## 2016-07-11 NOTE — Telephone Encounter (Signed)
Called patient to discuss the pathology results. No evidence of malignancy. No further follow-up needed.  All questions were answered.  Hollice Espy, MD

## 2016-08-17 ENCOUNTER — Encounter: Payer: Self-pay | Admitting: *Deleted

## 2016-08-20 ENCOUNTER — Encounter: Admission: RE | Payer: Self-pay | Source: Ambulatory Visit

## 2016-08-20 ENCOUNTER — Ambulatory Visit
Admission: RE | Admit: 2016-08-20 | Payer: Medicare Other | Source: Ambulatory Visit | Admitting: Unknown Physician Specialty

## 2016-08-20 SURGERY — COLONOSCOPY WITH PROPOFOL
Anesthesia: General

## 2016-08-28 ENCOUNTER — Encounter: Payer: Self-pay | Admitting: *Deleted

## 2016-08-29 ENCOUNTER — Encounter: Payer: Self-pay | Admitting: *Deleted

## 2016-08-29 ENCOUNTER — Ambulatory Visit
Admission: RE | Admit: 2016-08-29 | Discharge: 2016-08-29 | Disposition: A | Discharge status: Home / Self Care | Payer: Medicare Other | Source: Ambulatory Visit | Attending: Unknown Physician Specialty | Admitting: Unknown Physician Specialty

## 2016-08-29 ENCOUNTER — Ambulatory Visit: Payer: Medicare Other | Admitting: Anesthesiology

## 2016-08-29 ENCOUNTER — Encounter
Admission: RE | Disposition: A | Discharge status: Home / Self Care | Payer: Self-pay | Source: Ambulatory Visit | Attending: Unknown Physician Specialty

## 2016-08-29 DIAGNOSIS — F17210 Nicotine dependence, cigarettes, uncomplicated: Secondary | ICD-10-CM | POA: Diagnosis not present

## 2016-08-29 DIAGNOSIS — F329 Major depressive disorder, single episode, unspecified: Secondary | ICD-10-CM | POA: Diagnosis not present

## 2016-08-29 DIAGNOSIS — Z79899 Other long term (current) drug therapy: Secondary | ICD-10-CM | POA: Diagnosis not present

## 2016-08-29 DIAGNOSIS — Z1211 Encounter for screening for malignant neoplasm of colon: Secondary | ICD-10-CM | POA: Diagnosis present

## 2016-08-29 DIAGNOSIS — F419 Anxiety disorder, unspecified: Secondary | ICD-10-CM | POA: Insufficient documentation

## 2016-08-29 DIAGNOSIS — K648 Other hemorrhoids: Secondary | ICD-10-CM | POA: Insufficient documentation

## 2016-08-29 HISTORY — PX: COLONOSCOPY WITH PROPOFOL: SHX5780

## 2016-08-29 HISTORY — DX: Other psychoactive substance abuse, in remission: F19.11

## 2016-08-29 SURGERY — COLONOSCOPY WITH PROPOFOL
Anesthesia: General

## 2016-08-29 MED ORDER — MIDAZOLAM HCL 5 MG/5ML IJ SOLN
INTRAMUSCULAR | Status: DC | PRN
  Administered 2016-08-29: 2 mg via INTRAVENOUS

## 2016-08-29 MED ORDER — PROPOFOL 500 MG/50ML IV EMUL
INTRAVENOUS | Status: DC | PRN
  Administered 2016-08-29: 120 ug/kg/min via INTRAVENOUS

## 2016-08-29 MED ORDER — SODIUM CHLORIDE 0.9 % IV SOLN
INTRAVENOUS | Status: DC
  Administered 2016-08-29: 1000 mL via INTRAVENOUS

## 2016-08-29 MED ORDER — SODIUM CHLORIDE 0.9 % IV SOLN: INTRAVENOUS | Status: DC

## 2016-08-29 MED ORDER — MIDAZOLAM HCL 2 MG/2ML IJ SOLN
INTRAMUSCULAR | Status: AC
  Filled 2016-08-29: qty 2

## 2016-08-29 MED ORDER — LIDOCAINE 2% (20 MG/ML) 5 ML SYRINGE
INTRAMUSCULAR | Status: DC | PRN
  Administered 2016-08-29: 25 mg via INTRAVENOUS

## 2016-08-29 MED ORDER — PROPOFOL 10 MG/ML IV BOLUS
INTRAVENOUS | Status: DC | PRN
  Administered 2016-08-29: 70 mg via INTRAVENOUS
  Administered 2016-08-29: 30 mg via INTRAVENOUS

## 2016-08-29 MED ORDER — PROPOFOL 500 MG/50ML IV EMUL
INTRAVENOUS | Status: AC
  Filled 2016-08-29: qty 50

## 2016-08-29 NOTE — Op Note (Signed)
[facility] [address] [facility] Patient Name: Justin Stevens Procedure Date: 08/29/2016 2:32 PM MRN: [id number] Account #: [id number] Date of Birth: [birth year] Admit Type: Outpatient Age: 66 Room: [facility] ENDO ROOM 3 Gender: Male Note Status: Finalized Procedure:            Colonoscopy Indications:          Screening for colorectal malignant neoplasm Providers:            Manya Silvas, MD Referring MD:         [place], MD (Referring MD) Medicines:            Propofol per Anesthesia Complications:        No immediate complications. Procedure:            Pre-Anesthesia Assessment:                       - After reviewing the risks and benefits, the patient                        was deemed in satisfactory condition to undergo the                        procedure.                       After obtaining informed consent, the colonoscope was                        passed under direct vision. Throughout the procedure,                        the patient's blood pressure, pulse, and oxygen                        saturations were monitored continuously. The                        Colonoscope was introduced through the anus and                        advanced to the the cecum, identified by appendiceal                        orifice and ileocecal valve. The colonoscopy was                        performed without difficulty. The patient tolerated the                        procedure well. The colonoscopy was performed without                        difficulty. The patient tolerated the procedure well.                        The quality of the bowel preparation was good. Findings:      Internal hemorrhoids were found during endoscopy. The hemorrhoids were       small and Grade I (internal hemorrhoids that do not prolapse).      The exam was otherwise without abnormality. The cecum was  seen with       certainty and witnessed by nurse and  tech. Impression:           - Internal hemorrhoids.                       - The examination was otherwise normal.                       - No specimens collected. Recommendation:       - Repeat colonoscopy in 10 years. Manya Silvas, MD 08/29/2016 2:54:46 PM This report has been signed electronically. Number of Addenda: 0 Note Initiated On: 08/29/2016 2:32 PM Scope Withdrawal Time: 0 hours 7 minutes 45 seconds  Total Procedure Duration: 0 hours 14 minutes 32 seconds       [facility]

## 2016-08-29 NOTE — Transfer of Care (Signed)
Immediate Anesthesia Transfer of Care Note  Patient: Justin Stevens  Procedure(s) Performed: Procedure(s): COLONOSCOPY WITH PROPOFOL (N/A)  Patient Location: Endoscopy Unit  Anesthesia Type:General  Level of Consciousness: sedated  Airway & Oxygen Therapy: Patient Spontanous Breathing and Patient connected to nasal cannula oxygen  Post-op Assessment: Report given to RN and Post -op Vital signs reviewed and stable  Post vital signs: Reviewed  Last Vitals:  Vitals:   08/29/16 1343 08/29/16 1455  BP: 139/79 109/72  Pulse: 69 62  Resp: 20 18  Temp: 36.1 C 36.2 C    Last Pain:  Vitals:   08/29/16 1343  TempSrc: Tympanic      Patients Stated Pain Goal: 0 (24/26/83 4196)  Complications: No apparent anesthesia complications

## 2016-08-29 NOTE — Anesthesia Post-op Follow-up Note (Cosign Needed)
Anesthesia QCDR form completed.        
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
## 2016-08-29 NOTE — Anesthesia Preprocedure Evaluation (Signed)
Anesthesia Evaluation  Patient identified by MRN, date of birth, ID band Patient confused    Reviewed: Allergy & Precautions, NPO status   Airway Mallampati: II       Dental  (+) Poor Dentition   Pulmonary COPD, Current Smoker,     + decreased breath sounds      Cardiovascular Exercise Tolerance: Good  Rhythm:Regular     Neuro/Psych Anxiety Depression    GI/Hepatic negative GI ROS, Neg liver ROS,   Endo/Other  negative endocrine ROS  Renal/GU negative Renal ROS     Musculoskeletal   Abdominal Normal abdominal exam  (+)   Peds negative pediatric ROS (+)  Hematology negative hematology ROS (+)   Anesthesia Other Findings   Reproductive/Obstetrics                             Anesthesia Physical Anesthesia Plan  ASA: III  Anesthesia Plan: General   Post-op Pain Management:    Induction: Intravenous  Airway Management Planned: Natural Airway and Nasal Cannula  Additional Equipment:   Intra-op Plan: Utilization of Controlled Hypotension per surrgeon request  Post-operative Plan:   Informed Consent: I have reviewed the patients History and Physical, chart, labs and discussed the procedure including the risks, benefits and alternatives for the proposed anesthesia with the patient or authorized representative who has indicated his/her understanding and acceptance.     Plan Discussed with: CRNA  Anesthesia Plan Comments:         Anesthesia Quick Evaluation

## 2016-08-30 ENCOUNTER — Encounter: Payer: Self-pay | Admitting: Unknown Physician Specialty

## 2016-09-05 NOTE — Anesthesia Postprocedure Evaluation (Signed)
Anesthesia Post Note  Patient: Justin Stevens  Procedure(s) Performed: Procedure(s) (LRB): COLONOSCOPY WITH PROPOFOL (N/A)  Patient location during evaluation: PACU Anesthesia Type: General Level of consciousness: awake Pain management: pain level controlled Vital Signs Assessment: post-procedure vital signs reviewed and stable Respiratory status: spontaneous breathing Cardiovascular status: stable Anesthetic complications: no     Last Vitals:  Vitals:   08/29/16 1516 08/29/16 1526  BP: 131/86 (!) 141/97  Pulse: 60 (!) 58  Resp: 16 16  Temp:      Last Pain:  Vitals:   08/29/16 1456  TempSrc: Tympanic                 VAN STAVEREN,Naryiah Schley

## 2016-09-11 ENCOUNTER — Ambulatory Visit: Payer: Medicare Other | Admitting: Urology

## 2016-12-11 ENCOUNTER — Encounter: Payer: Self-pay | Admitting: Urology

## 2016-12-11 ENCOUNTER — Other Ambulatory Visit: Payer: Medicare Other
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
## 2016-12-18 ENCOUNTER — Ambulatory Visit: Payer: Medicare Other | Admitting: Urology
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
## 2016-12-20 ENCOUNTER — Ambulatory Visit (INDEPENDENT_AMBULATORY_CARE_PROVIDER_SITE_OTHER): Payer: Medicare Other | Admitting: Urology

## 2016-12-20 ENCOUNTER — Other Ambulatory Visit: Payer: Self-pay | Admitting: Urology

## 2016-12-20 ENCOUNTER — Encounter: Payer: Self-pay | Admitting: Urology

## 2016-12-20 VITALS — BP 113/84 | HR 70 | Ht 72.0 in | Wt 195.1 lb

## 2016-12-20 DIAGNOSIS — N401 Enlarged prostate with lower urinary tract symptoms: Secondary | ICD-10-CM

## 2016-12-20 DIAGNOSIS — N138 Other obstructive and reflux uropathy: Secondary | ICD-10-CM | POA: Diagnosis not present

## 2016-12-20 DIAGNOSIS — N400 Enlarged prostate without lower urinary tract symptoms: Secondary | ICD-10-CM

## 2016-12-20 DIAGNOSIS — Z87448 Personal history of other diseases of urinary system: Secondary | ICD-10-CM

## 2016-12-20 DIAGNOSIS — R350 Frequency of micturition: Secondary | ICD-10-CM

## 2016-12-20 DIAGNOSIS — N3941 Urge incontinence: Secondary | ICD-10-CM | POA: Diagnosis not present

## 2016-12-20 LAB — URINALYSIS, COMPLETE
Bilirubin, UA: NEGATIVE
Glucose, UA: NEGATIVE
Ketones, UA: NEGATIVE
NITRITE UA: POSITIVE — AB
PH UA: 5.5 (ref 5.0–7.5)
Protein, UA: NEGATIVE
RBC, UA: NEGATIVE
Specific Gravity, UA: >1.03 — ABNORMAL HIGH (ref 1.005–1.030)
Urobilinogen, Ur: 0.2 mg/dL (ref 0.2–1.0)

## 2016-12-20 LAB — MICROSCOPIC EXAMINATION

## 2016-12-20 LAB — BLADDER SCAN AMB NON-IMAGING: SCAN RESULT: 0

## 2016-12-20 MED ORDER — AMOXICILLIN-POT CLAVULANATE 875-125 MG PO TABS: 1.0000 | ORAL_TABLET | Freq: Two times a day (BID) | ORAL | 0 refills | Status: DC

## 2016-12-21 ENCOUNTER — Telehealth: Payer: Self-pay

## 2016-12-21 DIAGNOSIS — R972 Elevated prostate specific antigen [PSA]: Secondary | ICD-10-CM

## 2016-12-21 LAB — PSA: Prostate Specific Ag, Serum: 2.8 ng/mL (ref 0.0–4.0)

## 2016-12-21 NOTE — Telephone Encounter (Signed)
Called pt. No answer. Left message w/ Lta. Will have pt to return call.

## 2016-12-21 NOTE — Telephone Encounter (Signed)
-----   Message from Nori Riis, PA-C sent at 12/21/2016  7:54 AM EDT ----- Please let Mr. Hilmer's caregivers know that his PSA has increased from last year.  This may be due to his infection.  We will need to repeat the PSA after the infection is treated.

## 2016-12-24 ENCOUNTER — Other Ambulatory Visit: Payer: Self-pay | Admitting: Urology

## 2016-12-24 LAB — CULTURE, URINE COMPREHENSIVE

## 2016-12-24 NOTE — Progress Notes (Unsigned)
Please let Justin Stevens know that his urine culture is positive and the Augmentin is the appropriate antibiotic.  We need to recheck his PSA after he completes his antibiotics.

## 2016-12-24 NOTE — Telephone Encounter (Signed)
-----   Message from Nori Riis, PA-C sent at 12/21/2016  7:54 AM EDT ----- Please let [name]'s caregivers know that his PSA has increased from last year.  This may be due to his infection.  We will need to repeat the PSA after the infection is treated.

## 2016-12-25 NOTE — Telephone Encounter (Signed)
Linda patient's caregiver notified of message and PSA will be drawn again next week, order placed

## 2017-01-03 ENCOUNTER — Other Ambulatory Visit: Payer: Medicare Other

## 2017-01-03 DIAGNOSIS — R972 Elevated prostate specific antigen [PSA]: Secondary | ICD-10-CM

## 2017-01-04 LAB — PSA: PROSTATE SPECIFIC AG, SERUM: 1.7 ng/mL (ref 0.0–4.0)

## 2017-01-07 ENCOUNTER — Telehealth: Payer: Self-pay

## 2017-01-07 NOTE — Telephone Encounter (Signed)
No answer

## 2017-01-07 NOTE — Telephone Encounter (Signed)
-----   Message from Nori Riis, PA-C sent at 01/06/2017  1:00 PM EDT ----- Please let patient's caregivers know that his PSA is trending down..  I would like to repeat it again in three months to ensure the downward trend continues.

## 2017-01-11 NOTE — Telephone Encounter (Signed)
Not able to get in touch with pt. Will send a letter.

## 2017-02-27 DIAGNOSIS — G251 Drug-induced tremor: Secondary | ICD-10-CM | POA: Insufficient documentation

## 2017-03-21 ENCOUNTER — Other Ambulatory Visit: Payer: Self-pay | Admitting: Urology

## 2017-07-08 ENCOUNTER — Encounter (INDEPENDENT_AMBULATORY_CARE_PROVIDER_SITE_OTHER): Payer: Self-pay | Admitting: Vascular Surgery

## 2017-07-08 ENCOUNTER — Ambulatory Visit (INDEPENDENT_AMBULATORY_CARE_PROVIDER_SITE_OTHER): Payer: Medicare Other | Admitting: Vascular Surgery

## 2017-07-08 VITALS — BP 134/78 | HR 70 | Resp 16 | Ht 73.0 in | Wt 200.0 lb

## 2017-07-08 DIAGNOSIS — R600 Localized edema: Secondary | ICD-10-CM | POA: Diagnosis not present

## 2017-07-08 DIAGNOSIS — I82432 Acute embolism and thrombosis of left popliteal vein: Secondary | ICD-10-CM | POA: Diagnosis not present

## 2017-07-08 DIAGNOSIS — Z720 Tobacco use: Secondary | ICD-10-CM | POA: Diagnosis not present

## 2017-07-08 MED ORDER — APIXABAN 5 MG PO TABS: 5.0000 mg | ORAL_TABLET | Freq: Two times a day (BID) | ORAL | 5 refills | Status: DC
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
## 2017-07-19 ENCOUNTER — Other Ambulatory Visit: Payer: Self-pay | Admitting: Urology

## 2017-07-25 ENCOUNTER — Encounter (INDEPENDENT_AMBULATORY_CARE_PROVIDER_SITE_OTHER): Payer: Self-pay | Admitting: Vascular Surgery

## 2017-07-25 ENCOUNTER — Ambulatory Visit (INDEPENDENT_AMBULATORY_CARE_PROVIDER_SITE_OTHER): Payer: Medicare Other | Admitting: Vascular Surgery

## 2017-07-25 ENCOUNTER — Ambulatory Visit (INDEPENDENT_AMBULATORY_CARE_PROVIDER_SITE_OTHER): Payer: Medicare Other

## 2017-07-25 VITALS — BP 150/81 | HR 59 | Resp 16 | Ht 73.0 in | Wt 196.8 lb

## 2017-07-25 DIAGNOSIS — M79605 Pain in left leg: Secondary | ICD-10-CM

## 2017-07-25 DIAGNOSIS — R600 Localized edema: Secondary | ICD-10-CM | POA: Diagnosis not present

## 2017-07-25 DIAGNOSIS — I82432 Acute embolism and thrombosis of left popliteal vein: Secondary | ICD-10-CM

## 2017-07-25 DIAGNOSIS — M79606 Pain in leg, unspecified: Secondary | ICD-10-CM | POA: Insufficient documentation
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
## 2017-12-12 ENCOUNTER — Other Ambulatory Visit: Payer: Self-pay | Admitting: Family Medicine

## 2017-12-12 DIAGNOSIS — R972 Elevated prostate specific antigen [PSA]: Secondary | ICD-10-CM

## 2017-12-13 ENCOUNTER — Other Ambulatory Visit: Payer: Self-pay | Admitting: Urology

## 2017-12-16 ENCOUNTER — Encounter: Payer: Self-pay | Admitting: Urology

## 2017-12-16 ENCOUNTER — Other Ambulatory Visit: Payer: Medicare Other

## 2017-12-17 ENCOUNTER — Other Ambulatory Visit: Payer: Medicare Other

## 2017-12-17 DIAGNOSIS — R972 Elevated prostate specific antigen [PSA]: Secondary | ICD-10-CM

## 2017-12-18 ENCOUNTER — Encounter: Payer: Self-pay | Admitting: Urology

## 2017-12-18 ENCOUNTER — Ambulatory Visit (INDEPENDENT_AMBULATORY_CARE_PROVIDER_SITE_OTHER): Payer: Medicare Other | Admitting: Urology

## 2017-12-18 VITALS — BP 131/68 | HR 59 | Ht 72.0 in | Wt 194.8 lb

## 2017-12-18 DIAGNOSIS — Z87448 Personal history of other diseases of urinary system: Secondary | ICD-10-CM | POA: Diagnosis not present

## 2017-12-18 DIAGNOSIS — R972 Elevated prostate specific antigen [PSA]: Secondary | ICD-10-CM

## 2017-12-18 DIAGNOSIS — N138 Other obstructive and reflux uropathy: Secondary | ICD-10-CM

## 2017-12-18 DIAGNOSIS — N3942 Incontinence without sensory awareness: Secondary | ICD-10-CM | POA: Diagnosis not present

## 2017-12-18 DIAGNOSIS — N401 Enlarged prostate with lower urinary tract symptoms: Secondary | ICD-10-CM

## 2017-12-18 LAB — URINALYSIS, COMPLETE
BILIRUBIN UA: NEGATIVE
Glucose, UA: NEGATIVE
KETONES UA: NEGATIVE
Nitrite, UA: NEGATIVE
PH UA: 6 (ref 5.0–7.5)
Protein, UA: NEGATIVE
RBC UA: NEGATIVE
SPEC GRAV UA: 1.015 (ref 1.005–1.030)
Urobilinogen, Ur: 1 mg/dL (ref 0.2–1.0)

## 2017-12-18 LAB — MICROSCOPIC EXAMINATION
EPITHELIAL CELLS (NON RENAL): NONE SEEN /HPF (ref 0–10)
RBC MICROSCOPIC, UA: NONE SEEN /HPF (ref 0–2)

## 2017-12-18 LAB — BLADDER SCAN AMB NON-IMAGING: SCAN RESULT: 50

## 2017-12-18 LAB — PSA: PROSTATE SPECIFIC AG, SERUM: 2.9 ng/mL (ref 0.0–4.0)

## 2017-12-18 MED ORDER — MIRABEGRON ER 50 MG PO TB24: 50.0000 mg | ORAL_TABLET | Freq: Every day | ORAL | 3 refills | Status: DC
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
## 2018-01-14 ENCOUNTER — Ambulatory Visit: Payer: Medicare Other | Admitting: Urology

## 2018-01-14 ENCOUNTER — Other Ambulatory Visit (INDEPENDENT_AMBULATORY_CARE_PROVIDER_SITE_OTHER): Payer: Self-pay | Admitting: Vascular Surgery

## 2018-01-16 ENCOUNTER — Encounter (INDEPENDENT_AMBULATORY_CARE_PROVIDER_SITE_OTHER): Payer: Self-pay | Admitting: Vascular Surgery

## 2018-01-16 ENCOUNTER — Ambulatory Visit (INDEPENDENT_AMBULATORY_CARE_PROVIDER_SITE_OTHER): Payer: Medicare Other | Admitting: Vascular Surgery

## 2018-01-16 VITALS — BP 138/82 | HR 59 | Resp 16 | Wt 191.4 lb

## 2018-01-16 DIAGNOSIS — R600 Localized edema: Secondary | ICD-10-CM

## 2018-01-16 DIAGNOSIS — Z720 Tobacco use: Secondary | ICD-10-CM

## 2018-01-16 DIAGNOSIS — I82432 Acute embolism and thrombosis of left popliteal vein: Secondary | ICD-10-CM | POA: Diagnosis not present
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
## 2018-03-09 ENCOUNTER — Telehealth: Payer: Self-pay | Admitting: Urology

## 2018-03-09 NOTE — Telephone Encounter (Signed)
Would you check on the status of Justin Stevens's MRI of his prostate?

## 2018-03-11 NOTE — Telephone Encounter (Signed)
I called and transferred the patient's caregiver to [facility] to schedule since no one has ever called to schedule it. Asked her to cb to schedule his follow up.   Sharyn Lull

## 2018-03-27 ENCOUNTER — Other Ambulatory Visit: Payer: Medicare Other

## 2018-04-14 ENCOUNTER — Inpatient Hospital Stay: Admission: RE | Admit: 2018-04-14 | Payer: Medicare Other | Source: Ambulatory Visit

## 2018-04-23 ENCOUNTER — Inpatient Hospital Stay: Admission: RE | Admit: 2018-04-23 | Payer: Medicare Other | Source: Ambulatory Visit

## 2018-05-02 ENCOUNTER — Ambulatory Visit
Admission: RE | Admit: 2018-05-02 | Discharge: 2018-05-02 | Disposition: A | Discharge status: Home / Self Care | Payer: Medicare Other | Source: Ambulatory Visit | Attending: Urology | Admitting: Urology

## 2018-05-02 DIAGNOSIS — R972 Elevated prostate specific antigen [PSA]: Secondary | ICD-10-CM

## 2018-05-02 MED ORDER — GADOBENATE DIMEGLUMINE 529 MG/ML IV SOLN
17.0000 mL | Freq: Once | INTRAVENOUS | Status: AC | PRN
  Administered 2018-05-02: 17 mL via INTRAVENOUS

## 2018-05-05 ENCOUNTER — Telehealth: Payer: Self-pay

## 2018-05-05 NOTE — Telephone Encounter (Signed)
-----   Message from Nori Riis, PA-C sent at 05/05/2018  7:57 AM EST ----- Please let Mr. Jobson and his caregiver, Janetta Hora, that his MRI of his prostate was negative for any lesions concerning for an agressive prostate cancer.  This is good news.  We will need to see him in September for an I PSS, PVR and exam.  We will need a PSA and UA prior to his appointment.

## 2018-05-05 NOTE — Telephone Encounter (Signed)
Called number listed in chart, male answers and states that the correct number that I need to call is [phone]. Called that number given, no answer. No voicemail to leave message. 1st attempt.

## 2018-05-07 NOTE — Telephone Encounter (Signed)
2nd attempt. No answer and no voicemail.

## 2018-05-13 NOTE — Telephone Encounter (Signed)
Called pt, no answer. No voicemail. 3rd attempt will mail letter.

## 2018-06-13 ENCOUNTER — Other Ambulatory Visit (INDEPENDENT_AMBULATORY_CARE_PROVIDER_SITE_OTHER): Payer: Self-pay | Admitting: Vascular Surgery

## 2018-06-18 NOTE — Telephone Encounter (Signed)
Patient hasn't been seen since 10/19 with no upcoming follow up. Last office note has a prn return note. Please advise

## 2018-06-18 NOTE — Telephone Encounter (Signed)
Per Kim's last note, he is ok to stop anticoagulation. No refill at this time.

## 2018-07-12 ENCOUNTER — Other Ambulatory Visit (INDEPENDENT_AMBULATORY_CARE_PROVIDER_SITE_OTHER): Payer: Self-pay | Admitting: Vascular Surgery

## 2018-09-27 ENCOUNTER — Other Ambulatory Visit: Payer: Self-pay | Admitting: *Deleted

## 2018-09-27 DIAGNOSIS — Z20822 Contact with and (suspected) exposure to covid-19: Secondary | ICD-10-CM

## 2018-10-04 LAB — NOVEL CORONAVIRUS, NAA: SARS-CoV-2, NAA: NOT DETECTED

## 2018-10-06 ENCOUNTER — Telehealth: Payer: Self-pay | Admitting: *Deleted

## 2018-10-06 NOTE — Telephone Encounter (Signed)
Justin Stevens called for results for patient-   Patient identified with name and DOB-  Notified of negative results.

## 2018-11-12 ENCOUNTER — Other Ambulatory Visit: Payer: Self-pay | Admitting: Urology

## 2019-01-12 ENCOUNTER — Other Ambulatory Visit (INDEPENDENT_AMBULATORY_CARE_PROVIDER_SITE_OTHER): Payer: Self-pay | Admitting: Vascular Surgery

## 2019-01-13 ENCOUNTER — Other Ambulatory Visit (INDEPENDENT_AMBULATORY_CARE_PROVIDER_SITE_OTHER): Payer: Self-pay | Admitting: Vascular Surgery
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
## 2019-03-24 ENCOUNTER — Ambulatory Visit (INDEPENDENT_AMBULATORY_CARE_PROVIDER_SITE_OTHER): Payer: Medicare Other | Admitting: Urology

## 2019-03-24 ENCOUNTER — Encounter: Payer: Self-pay | Admitting: Urology

## 2019-03-24 ENCOUNTER — Other Ambulatory Visit: Payer: Self-pay

## 2019-03-24 VITALS — BP 143/83 | HR 65 | Ht 72.0 in | Wt 190.1 lb

## 2019-03-24 DIAGNOSIS — Z87448 Personal history of other diseases of urinary system: Secondary | ICD-10-CM

## 2019-03-24 DIAGNOSIS — N138 Other obstructive and reflux uropathy: Secondary | ICD-10-CM

## 2019-03-24 DIAGNOSIS — N401 Enlarged prostate with lower urinary tract symptoms: Secondary | ICD-10-CM | POA: Diagnosis not present

## 2019-03-24 DIAGNOSIS — N3942 Incontinence without sensory awareness: Secondary | ICD-10-CM

## 2019-03-24 DIAGNOSIS — R3989 Other symptoms and signs involving the genitourinary system: Secondary | ICD-10-CM

## 2019-03-24 LAB — BLADDER SCAN AMB NON-IMAGING: Scan Result: 1

## 2019-03-24 LAB — URINALYSIS, COMPLETE
Bilirubin, UA: NEGATIVE
Glucose, UA: NEGATIVE
Ketones, UA: NEGATIVE
Nitrite, UA: NEGATIVE
RBC, UA: NEGATIVE
Specific Gravity, UA: 1.025 (ref 1.005–1.030)
Urobilinogen, Ur: 2 mg/dL — ABNORMAL HIGH (ref 0.2–1.0)
pH, UA: 7.5 (ref 5.0–7.5)

## 2019-03-24 LAB — MICROSCOPIC EXAMINATION
Bacteria, UA: NONE SEEN
RBC, Urine: NONE SEEN /hpf (ref 0–2)

## 2019-03-25 ENCOUNTER — Telehealth: Payer: Self-pay | Admitting: Family Medicine

## 2019-03-25 LAB — PSA: Prostate Specific Ag, Serum: 1.7 ng/mL (ref 0.0–4.0)

## 2019-03-25 NOTE — Telephone Encounter (Signed)
Patients caregiver notified and voiced understanding.  

## 2019-03-25 NOTE — Telephone Encounter (Signed)
-----   Message from Nori Riis, PA-C sent at 03/25/2019  8:09 AM EST ----- Please let Mr. Spikes know that his PSA is good at 1.7.

## 2019-04-14 NOTE — Progress Notes (Deleted)
PTNS  Session # ***  Health & Social Factors: *** Caffeine: *** Alcohol: *** Daytime voids #per day: *** Night-time voids #per night: *** Urgency: *** Incontinence Episodes #per day: *** Ankle used: *** Treatment Setting: *** Feeling/ Response: *** Comments: ***  Preformed By: ***  Assistant: ***  Follow Up: ***  

## 2019-04-15 ENCOUNTER — Other Ambulatory Visit: Payer: Self-pay

## 2019-04-15 ENCOUNTER — Ambulatory Visit: Payer: Medicare Other | Admitting: Urology

## 2019-04-15 ENCOUNTER — Ambulatory Visit (INDEPENDENT_AMBULATORY_CARE_PROVIDER_SITE_OTHER): Payer: Medicare Other | Admitting: Urology

## 2019-04-15 DIAGNOSIS — R350 Frequency of micturition: Secondary | ICD-10-CM

## 2019-04-15 DIAGNOSIS — N3942 Incontinence without sensory awareness: Secondary | ICD-10-CM

## 2019-04-15 NOTE — Progress Notes (Signed)
PTNS  Session # 1  Health & Social Factors: no change Caffeine: 3 Alcohol: 0 Daytime voids #per day: 8 Night-time voids #per night: 1 Urgency: mild Incontinence Episodes #per day: 2 Ankle used: right Treatment Setting: 11 Feeling/ Response: sensory Comments: patient tolerated well  Preformed By: Elberta Leatherwood, CMA  Follow Up: 1 week

## 2019-04-21 NOTE — Progress Notes (Signed)
PTNS  Session # 2  Health & Social Factors: no change Caffeine: 3 Alcohol: 0 Daytime voids #per day: 5 Night-time voids #per night: 3 Urgency: mild Incontinence Episodes #per day: 2 Ankle used: right Treatment Setting: 11 Feeling/ Response: sensory Comments: Patient tolerated well  Preformed By: Elberta Leatherwood, CMA  Follow Up: 1 week #3

## 2019-04-22 ENCOUNTER — Ambulatory Visit (INDEPENDENT_AMBULATORY_CARE_PROVIDER_SITE_OTHER): Payer: Medicare Other | Admitting: Urology

## 2019-04-22 ENCOUNTER — Other Ambulatory Visit: Payer: Self-pay

## 2019-04-22 DIAGNOSIS — R350 Frequency of micturition: Secondary | ICD-10-CM | POA: Diagnosis not present

## 2019-04-29 ENCOUNTER — Ambulatory Visit (INDEPENDENT_AMBULATORY_CARE_PROVIDER_SITE_OTHER): Payer: Medicare Other | Admitting: Urology

## 2019-04-29 ENCOUNTER — Other Ambulatory Visit: Payer: Self-pay

## 2019-04-29 ENCOUNTER — Other Ambulatory Visit (INDEPENDENT_AMBULATORY_CARE_PROVIDER_SITE_OTHER): Payer: Self-pay | Admitting: Vascular Surgery

## 2019-04-29 ENCOUNTER — Encounter: Payer: Self-pay | Admitting: Urology

## 2019-04-29 VITALS — BP 142/74 | HR 68 | Ht 76.0 in | Wt 190.0 lb

## 2019-04-29 DIAGNOSIS — R350 Frequency of micturition: Secondary | ICD-10-CM | POA: Diagnosis not present

## 2019-04-29 NOTE — Telephone Encounter (Signed)
Per Kim's last note he was okay to stop Eliquis for treatment of DVT.  We will not renew prescription at this time.

## 2019-04-29 NOTE — Telephone Encounter (Signed)
Patient was last seen in 2019. Please advise.

## 2019-04-29 NOTE — Progress Notes (Signed)
PTNS  Session # 3  Health & Social Factors: no change Caffeine: 3 Alcohol: 0 Daytime voids #per day: 5 Night-time voids #per night: 3 Urgency: mild Incontinence Episodes #per day: 2 Ankle used: right Treatment Setting: 11 Feeling/ Response: sensory Comments: Patient tolerated well  Preformed By: Gaspar Cola CMA  Follow Up: 1 week #5

## 2019-05-06 ENCOUNTER — Other Ambulatory Visit: Payer: Self-pay

## 2019-05-06 ENCOUNTER — Ambulatory Visit (INDEPENDENT_AMBULATORY_CARE_PROVIDER_SITE_OTHER): Payer: Medicare Other | Admitting: Urology

## 2019-05-06 DIAGNOSIS — R350 Frequency of micturition: Secondary | ICD-10-CM | POA: Diagnosis not present

## 2019-05-06 NOTE — Progress Notes (Signed)
PTNS  Session # 4  Health & Social Factors: no change Caffeine: 3 Alcohol: 0 Daytime voids #per day: 5 Night-time voids #per night: 3 Urgency: mild Incontinence Episodes #per day: 2 Ankle used: right Treatment Setting: 6 Feeling/ Response: both Comments: patient tolerated well  Preformed By: Elberta Leatherwood, CMA  Follow Up: 1 week #5

## 2019-05-13 ENCOUNTER — Ambulatory Visit (INDEPENDENT_AMBULATORY_CARE_PROVIDER_SITE_OTHER): Payer: Medicare Other | Admitting: Urology

## 2019-05-13 ENCOUNTER — Other Ambulatory Visit: Payer: Self-pay

## 2019-05-13 DIAGNOSIS — R350 Frequency of micturition: Secondary | ICD-10-CM

## 2019-05-13 NOTE — Progress Notes (Signed)
PTNS  Session # 5  Health & Social Factors: No change Caffeine: 2 Alcohol: 0 Daytime voids #per day: 2 Night-time voids #per night: 2 Urgency: mild Incontinence Episodes #per day: 2 Ankle used: Left Treatment Setting: 19 Feeling/ Response: Sensory  Comments: Patient tolerated procedure well.   Performed By: Zara Council, PA-C   Follow Up: One week

## 2019-05-20 ENCOUNTER — Other Ambulatory Visit: Payer: Self-pay

## 2019-05-20 ENCOUNTER — Ambulatory Visit (INDEPENDENT_AMBULATORY_CARE_PROVIDER_SITE_OTHER): Payer: Medicare Other | Admitting: Urology

## 2019-05-20 ENCOUNTER — Encounter: Payer: Self-pay | Admitting: Urology

## 2019-05-20 DIAGNOSIS — R350 Frequency of micturition: Secondary | ICD-10-CM

## 2019-05-20 NOTE — Progress Notes (Signed)
PTNS  Session # 6  Health & Social Factors: No change Caffeine: 2 Alcohol: 0 Daytime voids #per day: 2 Night-time voids #per night: 2 Urgency: mild Incontinence Episodes #per day: 2 Ankle used: right Treatment Setting: 5 Feeling/ Response: Sensory Comments: Patient tolerated well  Preformed By: Elberta Leatherwood, CMA  Follow Up: 1 week #7

## 2019-05-27 ENCOUNTER — Ambulatory Visit (INDEPENDENT_AMBULATORY_CARE_PROVIDER_SITE_OTHER): Payer: Medicare Other | Admitting: Urology

## 2019-05-27 ENCOUNTER — Other Ambulatory Visit: Payer: Self-pay

## 2019-05-27 DIAGNOSIS — R350 Frequency of micturition: Secondary | ICD-10-CM

## 2019-05-27 NOTE — Progress Notes (Signed)
PTNS  Session # 7  Health & Social Factors: no change Caffeine: 2 Alcohol: 0 Daytime voids #per day: 1-2 Night-time voids #per night: 0 Urgency: mild Incontinence Episodes #per day: 0 Ankle used: left Treatment Setting: 9 Feeling/ Response: sensory Comments: Patient tolerated well  Preformed By: Elberta Leatherwood, CMA  Follow Up: 1 week # 8

## 2019-06-03 ENCOUNTER — Ambulatory Visit (INDEPENDENT_AMBULATORY_CARE_PROVIDER_SITE_OTHER): Payer: Medicare Other | Admitting: Urology

## 2019-06-03 ENCOUNTER — Other Ambulatory Visit: Payer: Self-pay

## 2019-06-03 DIAGNOSIS — R350 Frequency of micturition: Secondary | ICD-10-CM | POA: Diagnosis not present

## 2019-06-03 NOTE — Progress Notes (Signed)
PTNS  Session # 8  Health & Social Factors: No change Caffeine: 2 Alcohol: 0 Daytime voids #per day: 1 Night-time voids #per night: 0 Urgency: mild Incontinence Episodes #per day: 0 Ankle used: Left Treatment Setting: 3 Feeling/ Response: sensory Comments: Tolerated the procedure well.   Performed By: Zara Council, PA-C  Assistant: Kyra Manges, CMA  Follow Up: One week

## 2019-06-10 ENCOUNTER — Other Ambulatory Visit: Payer: Self-pay

## 2019-06-10 ENCOUNTER — Ambulatory Visit (INDEPENDENT_AMBULATORY_CARE_PROVIDER_SITE_OTHER): Payer: Medicare Other | Admitting: Urology

## 2019-06-10 DIAGNOSIS — R350 Frequency of micturition: Secondary | ICD-10-CM | POA: Diagnosis not present

## 2019-06-10 NOTE — Progress Notes (Signed)
PTNS  Session # 9  Health & Social Factors:  Caffeine: 2 Alcohol: 0 Daytime voids #per day: 1 Night-time voids #per night: 1 Urgency: mild Incontinence Episodes #per day: 1 Ankle used: Right Treatment Setting: 15 Feeling/ Response: sensory Comments: patient tolerated the procedure well   Performed By: Zara Council, PA-C  Follow Up: One Week

## 2019-06-17 ENCOUNTER — Ambulatory Visit: Payer: Medicare Other | Admitting: Urology

## 2019-06-18 ENCOUNTER — Encounter: Payer: Self-pay | Admitting: Urology

## 2019-06-24 ENCOUNTER — Ambulatory Visit (INDEPENDENT_AMBULATORY_CARE_PROVIDER_SITE_OTHER): Payer: Medicare Other | Admitting: Urology

## 2019-06-24 ENCOUNTER — Other Ambulatory Visit: Payer: Self-pay

## 2019-06-24 DIAGNOSIS — R350 Frequency of micturition: Secondary | ICD-10-CM

## 2019-06-24 NOTE — Progress Notes (Signed)
PTNS  Session # 10  Health & Social Factors: No change Caffeine: 8 Alcohol: 0 Daytime voids #per day: 2 Night-time voids #per night: 0 Urgency: Strong Incontinence Episodes #per day: 0 Ankle used: Left Treatment Setting: 9 Feeling/ Response: Sensory Comments: Patient tolerated the procedure well.   Performed By: Zara Council, PA-C  Follow Up: one week for PTNS # 11

## 2019-07-01 ENCOUNTER — Ambulatory Visit (INDEPENDENT_AMBULATORY_CARE_PROVIDER_SITE_OTHER): Payer: Medicare Other | Admitting: Urology

## 2019-07-01 ENCOUNTER — Other Ambulatory Visit: Payer: Self-pay

## 2019-07-01 DIAGNOSIS — R350 Frequency of micturition: Secondary | ICD-10-CM | POA: Diagnosis not present

## 2019-07-01 DIAGNOSIS — R413 Other amnesia: Secondary | ICD-10-CM | POA: Insufficient documentation

## 2019-07-01 DIAGNOSIS — N3942 Incontinence without sensory awareness: Secondary | ICD-10-CM

## 2019-07-01 DIAGNOSIS — R251 Tremor, unspecified: Secondary | ICD-10-CM | POA: Insufficient documentation

## 2019-07-01 NOTE — Patient Instructions (Signed)
Tracking Your Bladder Symptoms    Patient Name:___________________________________________________   Sample: Day   Daytime Voids  Nighttime Voids Urgency for the Day(0-4) Number of Accidents Beverage Comments  Monday IIII II 2 I Water IIII Coffee  I      Week Starting:____________________________________   Day Daytime  Voids Nighttime  Voids Urgency for  The Day(0-4) Number of Accidents Beverages Comments                                                           This week my symptoms were:  O much better  O better O the same O worse   

## 2019-07-01 NOTE — Progress Notes (Signed)
PTNS  Session # 11   Health & Social Factors: no change Caffeine: 0 Alcohol: 0 Daytime voids #per day: 1-2 Night-time voids #per night: 1 Urgency: mild Incontinence Episodes #per day: 0 Ankle used: Left Treatment Setting: 13 Feeling/ Response: Toe Flex Comments: Patient tolerated well.  Preformed By: Kerman Passey, RMA.    Follow Up: 1 week.

## 2019-07-08 ENCOUNTER — Ambulatory Visit (INDEPENDENT_AMBULATORY_CARE_PROVIDER_SITE_OTHER): Payer: Medicare Other | Admitting: Urology

## 2019-07-08 ENCOUNTER — Other Ambulatory Visit: Payer: Self-pay

## 2019-07-08 DIAGNOSIS — R350 Frequency of micturition: Secondary | ICD-10-CM

## 2019-07-08 NOTE — Progress Notes (Signed)
PTNS  Session # 12  Health & Social Factors: No change Caffeine: 1 Alcohol: 0 Daytime voids #per day: 2 Night-time voids #per night: 1 Urgency: mild Incontinence Episodes #per day: 0 Ankle used: 5 Treatment Setting: Left Feeling/ Response: Both Comments: Patient tolerated the procedure well.    Performed By: Zara Council, PA-C  Assistant: Elberta Leatherwood, CMA  Follow Up: One month

## 2019-08-10 ENCOUNTER — Other Ambulatory Visit: Payer: Self-pay

## 2019-08-10 ENCOUNTER — Ambulatory Visit (INDEPENDENT_AMBULATORY_CARE_PROVIDER_SITE_OTHER): Payer: Medicare Other

## 2019-08-10 DIAGNOSIS — R350 Frequency of micturition: Secondary | ICD-10-CM

## 2019-08-10 NOTE — Progress Notes (Signed)
PTNS  Session # Maintenence  Health & Social Factors: No change Caffeine: 1 Alcohol: 0 Daytime voids #per day: 2 Night-time voids #per night: 1 Urgency: mild Incontinence Episodes #per day: 0 Ankle used: Right Treatment Setting: 5 Feeling/ Response: Toe flex & sensory Comments: patient tolerated well, no complications were noted.   Performed By: Bradly Bienenstock, CMA   Follow Up: RTC one month for office visit with [facility] and monthly PTNS.

## 2019-08-10 NOTE — Patient Instructions (Signed)
Tracking Your Bladder Symptoms    Patient Name:___________________________________________________   Sample: Day   Daytime Voids  Nighttime Voids Urgency for the Day(0-4) Number of Accidents Beverage Comments  Monday IIII II 2 I Water IIII Coffee  I      Week Starting:____________________________________   Day Daytime  Voids Nighttime  Voids Urgency for  The Day(0-4) Number of Accidents Beverages Comments                                                           This week my symptoms were:  O much better  O better O the same O worse   

## 2019-09-15 ENCOUNTER — Other Ambulatory Visit: Payer: Self-pay

## 2019-09-15 ENCOUNTER — Ambulatory Visit (INDEPENDENT_AMBULATORY_CARE_PROVIDER_SITE_OTHER): Payer: Medicare Other | Admitting: Urology

## 2019-09-15 DIAGNOSIS — R350 Frequency of micturition: Secondary | ICD-10-CM | POA: Diagnosis not present

## 2019-09-15 NOTE — Progress Notes (Signed)
PTNS  Session # Maintenance  Health & Social Factors:No change Caffeine: 1 Alcohol: 0 Daytime voids #per day: 2 Night-time voids #per night: 1 Urgency: Mild Incontinence Episodes #per day: 0 Ankle used: Left Treatment Setting: 3 Feeling/ Response: Sensory Comments: He tolerated the procedure well.  Preformed By: Sherrill Raring, PA-S  Assistant: Zara Council, PA-C  Follow Up:1 month  I, Kyla Peace, am acting as a Education administrator for [employer], PA-C  I have reviewed the above documentation for accuracy and completeness, and I agree with the above.    Zara Council, PA-C

## 2019-10-14 NOTE — Progress Notes (Deleted)
PTNS  Session # ***  Health & Social Factors: *** Caffeine: *** Alcohol: *** Daytime voids #per day: *** Night-time voids #per night: *** Urgency: *** Incontinence Episodes #per day: *** Ankle used: *** Treatment Setting: *** Feeling/ Response: *** Comments: ***  Preformed By: ***  Assistant: ***  Follow Up: ***

## 2019-10-15 ENCOUNTER — Ambulatory Visit: Payer: Medicare Other | Admitting: Urology

## 2019-10-27 NOTE — Progress Notes (Signed)
PTNS  Session # monthly maintenance  Health & Social Factors: No change Caffeine: 1 Alcohol: 0 Daytime voids #per day: 2 Night-time voids #per night: 2 Urgency: mild Incontinence Episodes #per day: 0 Ankle used: Left Treatment Setting: 4 Feeling/ Response: Toe flex and sensory Comments: Patient tolerated well  Preformed By: Kerman Passey, RMA  Follow Up: 1 month maintenance

## 2019-10-28 ENCOUNTER — Other Ambulatory Visit: Payer: Self-pay

## 2019-10-28 ENCOUNTER — Ambulatory Visit (INDEPENDENT_AMBULATORY_CARE_PROVIDER_SITE_OTHER): Payer: Medicare Other | Admitting: Urology

## 2019-10-28 DIAGNOSIS — N401 Enlarged prostate with lower urinary tract symptoms: Secondary | ICD-10-CM

## 2019-10-28 DIAGNOSIS — R350 Frequency of micturition: Secondary | ICD-10-CM

## 2019-10-29 ENCOUNTER — Telehealth: Payer: Self-pay | Admitting: Family Medicine

## 2019-10-29 LAB — PSA: Prostate Specific Ag, Serum: 0.6 ng/mL (ref 0.0–4.0)

## 2019-10-29 NOTE — Telephone Encounter (Signed)
-----   Message from Nori Riis, PA-C sent at 10/29/2019  8:26 AM EDT ----- Please let Mr. Bracco know that his PSA is good at 0.6.

## 2019-10-29 NOTE — Telephone Encounter (Signed)
LM for patient to return call.

## 2019-11-02 NOTE — Telephone Encounter (Signed)
Patients caregiver notified and voiced understanding.  

## 2019-11-30 ENCOUNTER — Other Ambulatory Visit: Payer: Self-pay

## 2019-11-30 ENCOUNTER — Ambulatory Visit (INDEPENDENT_AMBULATORY_CARE_PROVIDER_SITE_OTHER): Payer: Medicare Other | Admitting: Family Medicine

## 2019-11-30 DIAGNOSIS — R350 Frequency of micturition: Secondary | ICD-10-CM | POA: Diagnosis not present

## 2019-11-30 DIAGNOSIS — N138 Other obstructive and reflux uropathy: Secondary | ICD-10-CM

## 2019-11-30 NOTE — Progress Notes (Signed)
PTNS  Session # monthly maintenance  Health & Social Factors: No change Caffeine: 1 Alcohol: 0 Daytime voids #per day: 2 Night-time voids #per night: 2 Urgency: mild Incontinence Episodes #per day: 0 Ankle used:  Treatment Setting:  Feeling/ Response: Toe flex and sensory Comments: Patient tolerated well  Preformed By: Gaspar Cola CMA  Follow up in one month

## 2019-12-08 ENCOUNTER — Ambulatory Visit: Payer: Self-pay

## 2019-12-28 ENCOUNTER — Ambulatory Visit: Payer: Medicare Other

## 2020-01-25 ENCOUNTER — Telehealth: Payer: Self-pay | Admitting: Urology

## 2020-01-25 NOTE — Telephone Encounter (Signed)
Mr. Macomber will need a follow up appointment in December to check his PSA and check his urine.

## 2020-01-26 NOTE — Telephone Encounter (Signed)
Appointments scheduled

## 2020-03-15 ENCOUNTER — Other Ambulatory Visit: Payer: Self-pay

## 2020-03-15 ENCOUNTER — Other Ambulatory Visit: Payer: Medicare Other

## 2020-03-15 DIAGNOSIS — N138 Other obstructive and reflux uropathy: Secondary | ICD-10-CM

## 2020-03-16 LAB — PSA: Prostate Specific Ag, Serum: 2.4 ng/mL (ref 0.0–4.0)

## 2020-03-18 ENCOUNTER — Encounter: Payer: Self-pay | Admitting: Urology

## 2020-03-18 ENCOUNTER — Ambulatory Visit: Payer: Medicare Other | Admitting: Urology

## 2020-04-11 ENCOUNTER — Telehealth: Payer: Self-pay | Admitting: Urology

## 2020-04-11 NOTE — Telephone Encounter (Signed)
Would you call [name] and have him reschedule his missed appointment in December?
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
## 2020-04-21 ENCOUNTER — Ambulatory Visit: Payer: Medicare Other | Admitting: Urology

## 2020-04-21 DIAGNOSIS — N138 Other obstructive and reflux uropathy: Secondary | ICD-10-CM

## 2020-04-21 DIAGNOSIS — R3989 Other symptoms and signs involving the genitourinary system: Secondary | ICD-10-CM

## 2020-04-21 DIAGNOSIS — R319 Hematuria, unspecified: Secondary | ICD-10-CM

## 2020-04-21 DIAGNOSIS — N3942 Incontinence without sensory awareness: Secondary | ICD-10-CM

## 2020-04-22 ENCOUNTER — Encounter: Payer: Self-pay | Admitting: Urology

## 2020-05-02 ENCOUNTER — Encounter: Payer: Self-pay | Admitting: Urology
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
## 2020-05-04 ENCOUNTER — Ambulatory Visit (INDEPENDENT_AMBULATORY_CARE_PROVIDER_SITE_OTHER): Payer: Medicare Other | Admitting: Urology

## 2020-05-04 ENCOUNTER — Other Ambulatory Visit: Payer: Self-pay

## 2020-05-04 VITALS — BP 124/70 | HR 83 | Ht 72.0 in | Wt 160.0 lb

## 2020-05-04 DIAGNOSIS — R350 Frequency of micturition: Secondary | ICD-10-CM | POA: Diagnosis not present

## 2020-05-04 DIAGNOSIS — N401 Enlarged prostate with lower urinary tract symptoms: Secondary | ICD-10-CM

## 2020-05-04 DIAGNOSIS — N3942 Incontinence without sensory awareness: Secondary | ICD-10-CM

## 2020-05-04 DIAGNOSIS — N138 Other obstructive and reflux uropathy: Secondary | ICD-10-CM | POA: Diagnosis not present

## 2020-05-04 LAB — BLADDER SCAN AMB NON-IMAGING: Scan Result: 0

## 2020-05-04 NOTE — Patient Instructions (Signed)
Cystoscopy Cystoscopy is a procedure that is used to help diagnose and sometimes treat conditions that affect the lower urinary tract. The lower urinary tract includes the bladder and the urethra. The urethra is the tube that drains urine from the bladder. Cystoscopy is done using a thin, tube-shaped instrument with a light and camera at the end (cystoscope). The cystoscope may be hard or flexible, depending on the goal of the procedure. The cystoscope is inserted through the urethra, into the bladder. Cystoscopy may be recommended if you have:  Urinary tract infections that keep coming back.  Blood in the urine (hematuria).  An inability to control when you urinate (urinary incontinence) or an overactive bladder.  Unusual cells found in a urine sample.  A blockage in the urethra, such as a urinary stone.  Painful urination.  An abnormality in the bladder found during an intravenous pyelogram (IVP) or CT scan. Cystoscopy may also be done to remove a sample of tissue to be examined under a microscope (biopsy). What are the risks? Generally, this is a safe procedure. However, problems may occur, including:  Infection.  Bleeding.  What happens during the procedure?  1. You will be given one or more of the following: ? A medicine to numb the area (local anesthetic). 2. The area around the opening of your urethra will be cleaned. 3. The cystoscope will be passed through your urethra into your bladder. 4. Germ-free (sterile) fluid will flow through the cystoscope to fill your bladder. The fluid will stretch your bladder so that your health care provider can clearly examine your bladder walls. 5. Your doctor will look at the urethra and bladder. 6. The cystoscope will be removed The procedure may vary among health care providers  What can I expect after the procedure? After the procedure, it is common to have: 1. Some soreness or pain in your abdomen and urethra. 2. Urinary symptoms.  These include: ? Mild pain or burning when you urinate. Pain should stop within a few minutes after you urinate. This may last for up to 1 week. ? A small amount of blood in your urine for several days. ? Feeling like you need to urinate but producing only a small amount of urine. Follow these instructions at home: General instructions  Return to your normal activities as told by your health care provider.   Do not drive for 24 hours if you were given a sedative during your procedure.  Watch for any blood in your urine. If the amount of blood in your urine increases, call your health care provider.  If a tissue sample was removed for testing (biopsy) during your procedure, it is up to you to get your test results. Ask your health care provider, or the department that is doing the test, when your results will be ready.  Drink enough fluid to keep your urine pale yellow.  Keep all follow-up visits as told by your health care provider. This is important. Contact a health care provider if you:  Have pain that gets worse or does not get better with medicine, especially pain when you urinate.  Have trouble urinating.  Have more blood in your urine. Get help right away if you:  Have blood clots in your urine.  Have abdominal pain.  Have a fever or chills.  Are unable to urinate. Summary  Cystoscopy is a procedure that is used to help diagnose and sometimes treat conditions that affect the lower urinary tract.  Cystoscopy is done using   a thin, tube-shaped instrument with a light and camera at the end.  After the procedure, it is common to have some soreness or pain in your abdomen and urethra.  Watch for any blood in your urine. If the amount of blood in your urine increases, call your health care provider.  If you were prescribed an antibiotic medicine, take it as told by your health care provider. Do not stop taking the antibiotic even if you start to feel better. This  information is not intended to replace advice given to you by your health care provider. Make sure you discuss any questions you have with your health care provider. Document Revised: 03/11/2018 Document Reviewed: 03/11/2018 Elsevier Patient Education  2020 Elsevier Inc.   

## 2020-05-05 LAB — URINALYSIS, COMPLETE
Bilirubin, UA: NEGATIVE
Glucose, UA: NEGATIVE
Ketones, UA: NEGATIVE
Nitrite, UA: NEGATIVE
Protein,UA: NEGATIVE
RBC, UA: NEGATIVE
Specific Gravity, UA: 1.02 (ref 1.005–1.030)
Urobilinogen, Ur: 4 mg/dL — ABNORMAL HIGH (ref 0.2–1.0)
pH, UA: 7 (ref 5.0–7.5)

## 2020-05-05 LAB — MICROSCOPIC EXAMINATION: Bacteria, UA: NONE SEEN

## 2020-05-30 ENCOUNTER — Ambulatory Visit: Payer: Medicare Other | Attending: Critical Care Medicine

## 2020-05-30 ENCOUNTER — Other Ambulatory Visit: Payer: Self-pay

## 2020-05-30 DIAGNOSIS — Z23 Encounter for immunization: Secondary | ICD-10-CM

## 2020-05-30 NOTE — Progress Notes (Signed)
   Covid-19 Vaccination Clinic  Name:  Mathius Birkeland    MRN: [id number] DOB: Jul 24, 1950  05/30/2020  Mr. Sibrian was observed post Covid-19 immunization for 15 minutes without incident. He was provided with Vaccine Information Sheet and instruction to access the V-Safe system.   Mr. Cush was instructed to call 911 with any severe reactions post vaccine: Marland Kitchen Difficulty breathing  . Swelling of face and throat  . A fast heartbeat  . A bad rash all over body  . Dizziness and weakness   Immunizations Administered    Name Date Dose VIS Date Route   PFIZER Comrnaty(Gray TOP) Covid-19 Vaccine 05/30/2020  3:10 PM 0.3 mL 03/10/2020 Intramuscular   Manufacturer: [place]   Lot: [id number]   NDC: [phone]

## 2020-06-01 NOTE — Progress Notes (Signed)
° °  06/07/20   CC:  Chief Complaint  Patient presents with   Cysto    HPI: 70 year old male with personal history of urinary incontinence and remote history of hematuria who presents today for cystoscopic evaluation.  Please see [facility] Sessions notes for details.  Notably, the patient has a legal guardian.  His next of kin, cousin was contacted today for verbal consent.  NED. A&Ox3.   No respiratory distress   Abd soft, NT, ND Normal external genitalia with patent urethral meatus  Cystoscopy Procedure Note  Patient identification was confirmed, informed consent was obtained, and patient was prepped using Betadine solution.  Lidocaine jelly was administered per urethral meatus.    Procedure: - Flexible cystoscope introduced, without any difficulty.   - Thorough search of the bladder revealed:    Fairly subtle bulbar urethral stricture, mild and easily passable with scope    normal urothelium    no stones    no ulcers     no tumors    no urethral polyps    mild trabeculation with small diverticulum x 2 on right lateral bladder wall  - Ureteral orifices were normal in position and appearance.  Post-Procedure: - Patient tolerated the procedure well  Assessment/ Plan:  1. Urinary frequency Severe frequency/incontinence on Myrbetriq, continue this medication  No underlying bladder pathology appreciated today including no stones or tumors  Mr Challis does not seem to be bothered by symptoms, no further intervention planned  2. History of hematuria No microscopic hematuria today, cystoscopy fairly unremarkable  3. BPH with obstruction/lower urinary tract symptoms Prostate fairly unremarkable today on cystoscopy, continue Flomax - Urinalysis, Complete    Follow-up with Larene Beach in 6 months  Hollice Espy, MD

## 2020-06-05 ENCOUNTER — Encounter: Payer: Self-pay | Admitting: Urology

## 2020-06-07 ENCOUNTER — Ambulatory Visit (INDEPENDENT_AMBULATORY_CARE_PROVIDER_SITE_OTHER): Payer: Medicare Other | Admitting: Urology

## 2020-06-07 ENCOUNTER — Other Ambulatory Visit: Payer: Self-pay

## 2020-06-07 ENCOUNTER — Encounter: Payer: Self-pay | Admitting: Urology

## 2020-06-07 VITALS — BP 108/67 | HR 79

## 2020-06-07 DIAGNOSIS — R350 Frequency of micturition: Secondary | ICD-10-CM

## 2020-06-07 DIAGNOSIS — Z87448 Personal history of other diseases of urinary system: Secondary | ICD-10-CM

## 2020-06-07 DIAGNOSIS — N138 Other obstructive and reflux uropathy: Secondary | ICD-10-CM

## 2020-06-07 DIAGNOSIS — N401 Enlarged prostate with lower urinary tract symptoms: Secondary | ICD-10-CM

## 2020-06-08 LAB — MICROSCOPIC EXAMINATION: Bacteria, UA: NONE SEEN

## 2020-06-08 LAB — URINALYSIS, COMPLETE
Bilirubin, UA: NEGATIVE
Glucose, UA: NEGATIVE
Leukocytes,UA: NEGATIVE
Nitrite, UA: NEGATIVE
RBC, UA: NEGATIVE
Specific Gravity, UA: 1.025 (ref 1.005–1.030)
Urobilinogen, Ur: 2 mg/dL — ABNORMAL HIGH (ref 0.2–1.0)
pH, UA: 5.5 (ref 5.0–7.5)

## 2020-06-16 IMAGING — MR MR PROSTATE WO/W CM
56 series · 56 of 56 positions shown · IV contrast (Multihance 17ml)
Comparison: None.

CLINICAL DATA: Increased PSA velocity

EXAM:
MR PROSTATE WITHOUT AND WITH CONTRAST
TECHNIQUE: Multiplanar multisequence MRI images were obtained of the pelvis
centered about the prostate. Pre and post contrast images were
obtained.
CONTRAST:  17mL MULTIHANCE GADOBENATE DIMEGLUMINE 529 MG/ML IV SOLN

[Series 3: T1 · axial · 8.0mm · 1.06mm/px · 1 of 28 slices shown (1 of 2)]
[im 1/28]
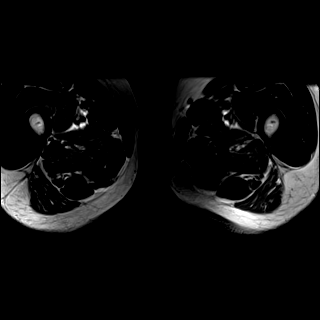

[Series 4: bSSFP fat-sat · axial · 8.0mm · 0.74mm/px · 1 of 28 slices shown]
[im 1/28]
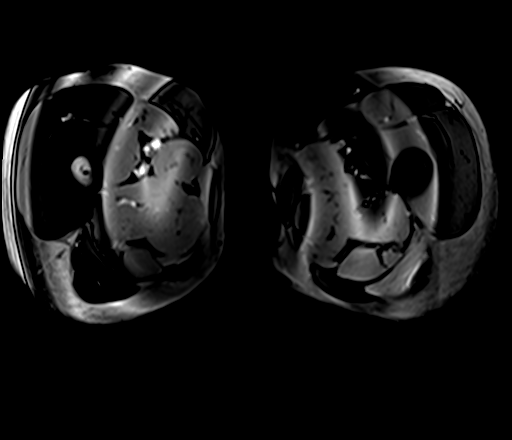

[Series 5: T2 · sagittal · 3.5mm · 0.56mm/px · 1 of 41 slices shown (1 of 4)]
[im 1/41]
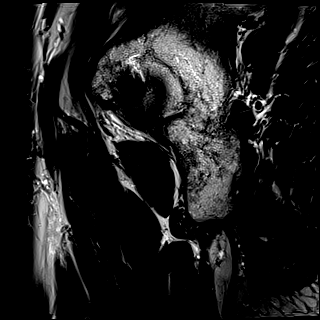

[Series 6: T1 · axial · 3.0mm · 0.31mm/px · 1 of 28 slices shown (2 of 2)]
[im 1/28]
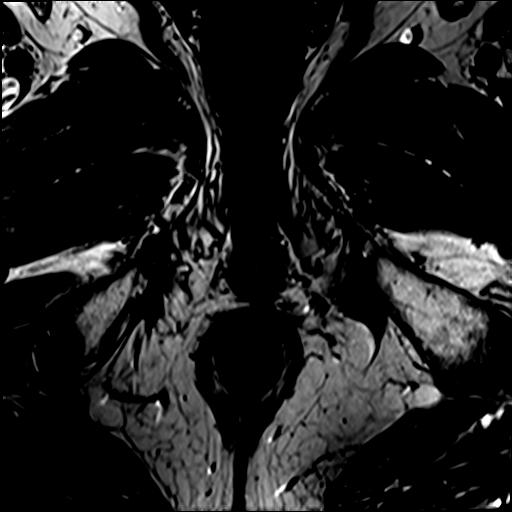

[Series 7: T2 · axial · 3.5mm · 0.56mm/px · 1 of 23 slices shown (2 of 4)]
[im 1/23]
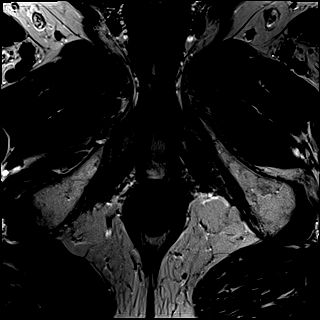

[Series 8: T2 · axial · 1.0mm · 1.04mm/px · 1 of 96 slices shown (3 of 4)]
[im 1/96]
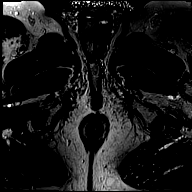

[Series 9: T2 · coronal · 3.5mm · 0.56mm/px · 1 of 25 slices shown (4 of 4)]
[im 1/25]
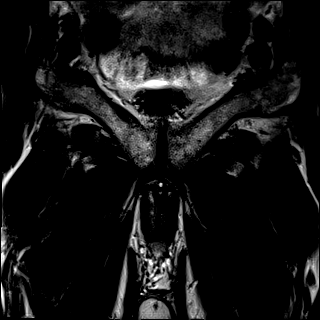

[Series 10: DWI · axial · 3.5mm · 1.56mm/px · 1 of 65 slices shown (1 of 2)]
[im 1/65]
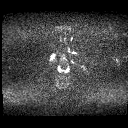

[Series 11: DWI · axial · 3.5mm · 1.56mm/px · 1 of 22 slices shown (2 of 2)]
[im 1/22]
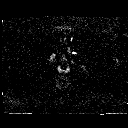

[Series 12: pre t1_twist_tra_dyn_ttc=5.8s · axial · non-contrast · 3.5mm · 0.83mm/px · 1 of 22 slices shown]
[im 1/22]
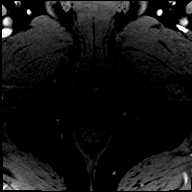

[Series 13: post t1_twist_tra_dyn-copy center · axial · 3.5mm · 0.83mm/px · 1 of 22 slices shown (1 of 24)]
[im 1/22]
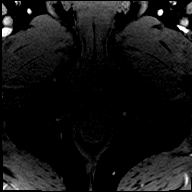

[Series 14: post t1_twist_tra_dyn-copy center · axial · 3.5mm · 0.83mm/px · 1 of 22 slices shown (2 of 24)]
[im 1/22]
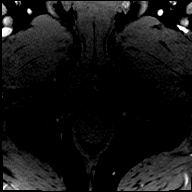

[Series 15: post t1_twist_tra_dyn-copy cent_sub_ttc=(id) · axial · 3.5mm · 0.83mm/px · 1 of 22 slices shown (1 of 22)]
[im 1/22]
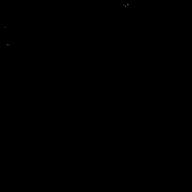

[Series 16: post t1_twist_tra_dyn-copy center · axial · 3.5mm · 0.83mm/px · 1 of 22 slices shown (3 of 24)]
[im 1/22]
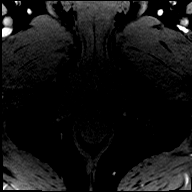

[Series 17: post t1_twist_tra_dyn-copy cent_sub_ttc=(id) · axial · 3.5mm · 0.83mm/px · 1 of 22 slices shown (2 of 22)]
[im 1/22]
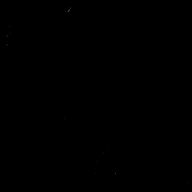

[Series 18: post t1_twist_tra_dyn-copy center · axial · 3.5mm · 0.83mm/px · 1 of 22 slices shown (4 of 24)]
[im 1/22]
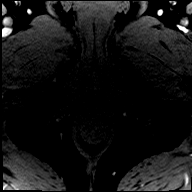

[Series 19: post t1_twist_tra_dyn-copy cent_sub_ttc=(id) · axial · 3.5mm · 0.83mm/px · 1 of 22 slices shown (3 of 22)]
[im 1/22]
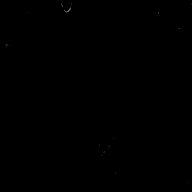

[Series 20: post t1_twist_tra_dyn-copy center · axial · 3.5mm · 0.83mm/px · 1 of 22 slices shown (5 of 24)]
[im 1/22]
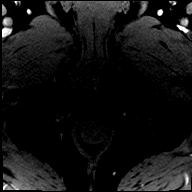

[Series 21: post t1_twist_tra_dyn-copy cent_sub_ttc=(id) · axial · 3.5mm · 0.83mm/px · 1 of 22 slices shown (4 of 22)]
[im 1/22]
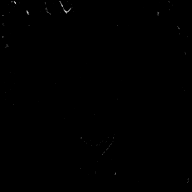

[Series 22: post t1_twist_tra_dyn-copy center · axial · 3.5mm · 0.83mm/px · 1 of 22 slices shown (6 of 24)]
[im 1/22]
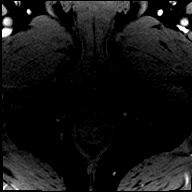

[Series 23: post t1_twist_tra_dyn-copy cent_sub_ttc=(id) · axial · 3.5mm · 0.83mm/px · 1 of 22 slices shown (5 of 22)]
[im 1/22]
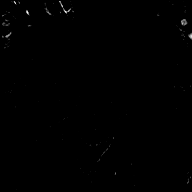

[Series 24: post t1_twist_tra_dyn-copy center · axial · 3.5mm · 0.83mm/px · 1 of 22 slices shown (7 of 24)]
[im 1/22]
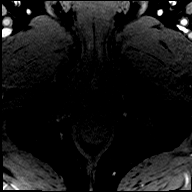

[Series 25: post t1_twist_tra_dyn-copy cent_sub_ttc=(id) · axial · 3.5mm · 0.83mm/px · 1 of 22 slices shown (6 of 22)]
[im 1/22]
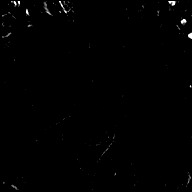

[Series 26: post t1_twist_tra_dyn-copy center · axial · 3.5mm · 0.83mm/px · 1 of 22 slices shown (8 of 24)]
[im 1/22]
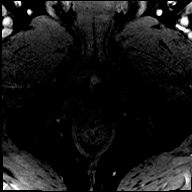

[Series 27: post t1_twist_tra_dyn-copy cent_sub_ttc=(id) · axial · 3.5mm · 0.83mm/px · 1 of 22 slices shown (7 of 22)]
[im 1/22]
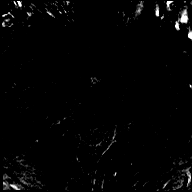

[Series 28: post t1_twist_tra_dyn-copy center · axial · 3.5mm · 0.83mm/px · 1 of 22 slices shown (9 of 24)]
[im 1/22]
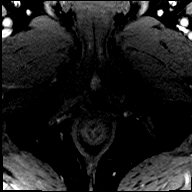

[Series 29: post t1_twist_tra_dyn-copy cent_sub_ttc=(id) · axial · 3.5mm · 0.83mm/px · 1 of 22 slices shown (8 of 22)]
[im 1/22]
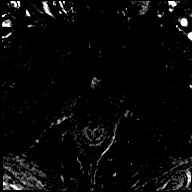

[Series 30: post t1_twist_tra_dyn-copy center · axial · 3.5mm · 0.83mm/px · 1 of 22 slices shown (10 of 24)]
[im 1/22]
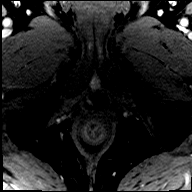

[Series 31: post t1_twist_tra_dyn-copy cent_sub_ttc=(id) · axial · 3.5mm · 0.83mm/px · 1 of 22 slices shown (9 of 22)]
[im 1/22]
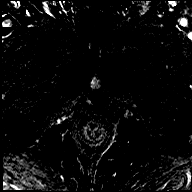

[Series 32: post t1_twist_tra_dyn-copy center · axial · 3.5mm · 0.83mm/px · 1 of 22 slices shown (11 of 24)]
[im 1/22]
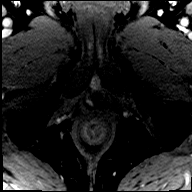

[Series 33: post t1_twist_tra_dyn-copy cent_sub_ttc=(id) · axial · 3.5mm · 0.83mm/px · 1 of 22 slices shown (10 of 22)]
[im 1/22]
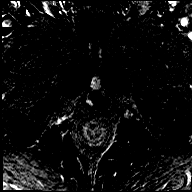

[Series 34: post t1_twist_tra_dyn-copy center · axial · 3.5mm · 0.83mm/px · 1 of 22 slices shown (12 of 24)]
[im 1/22]
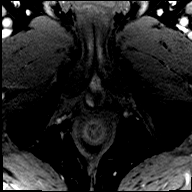

[Series 35: post t1_twist_tra_dyn-copy cent_sub_ttc=(id) · axial · 3.5mm · 0.83mm/px · 1 of 22 slices shown (11 of 22)]
[im 1/22]
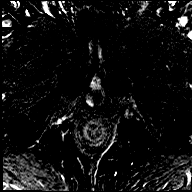

[Series 36: post t1_twist_tra_dyn-copy center · axial · 3.5mm · 0.83mm/px · 1 of 22 slices shown (13 of 24)]
[im 1/22]
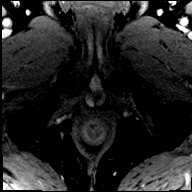

[Series 37: post t1_twist_tra_dyn-copy cent_sub_ttc=(id) · axial · 3.5mm · 0.83mm/px · 1 of 22 slices shown (12 of 22)]
[im 1/22]
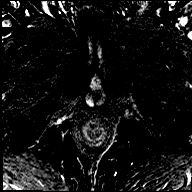

[Series 38: post t1_twist_tra_dyn-copy center · axial · 3.5mm · 0.83mm/px · 1 of 22 slices shown (14 of 24)]
[im 1/22]
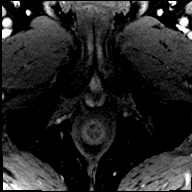

[Series 39: post t1_twist_tra_dyn-copy cent_sub_ttc=(id) · axial · 3.5mm · 0.83mm/px · 1 of 22 slices shown (13 of 22)]
[im 1/22]
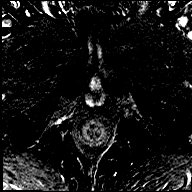

[Series 40: post t1_twist_tra_dyn-copy center · axial · 3.5mm · 0.83mm/px · 1 of 22 slices shown (15 of 24)]
[im 1/22]
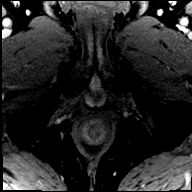

[Series 41: post t1_twist_tra_dyn-copy cent_sub_ttc=(id) · axial · 3.5mm · 0.83mm/px · 1 of 22 slices shown (14 of 22)]
[im 1/22]
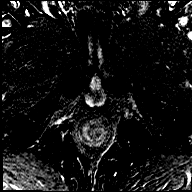

[Series 42: post t1_twist_tra_dyn-copy center · axial · 3.5mm · 0.83mm/px · 1 of 22 slices shown (16 of 24)]
[im 1/22]
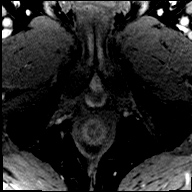

[Series 43: post t1_twist_tra_dyn-copy cent_sub_ttc=(id) · axial · 3.5mm · 0.83mm/px · 1 of 22 slices shown (15 of 22)]
[im 1/22]
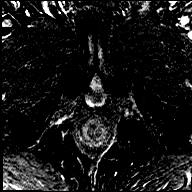

[Series 44: post t1_twist_tra_dyn-copy center · axial · 3.5mm · 0.83mm/px · 1 of 22 slices shown (17 of 24)]
[im 1/22]
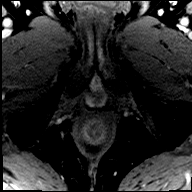

[Series 45: post t1_twist_tra_dyn-copy cent_sub_ttc=(id) · axial · 3.5mm · 0.83mm/px · 1 of 22 slices shown (16 of 22)]
[im 1/22]
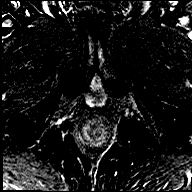

[Series 46: post t1_twist_tra_dyn-copy center · axial · 3.5mm · 0.83mm/px · 1 of 22 slices shown (18 of 24)]
[im 1/22]
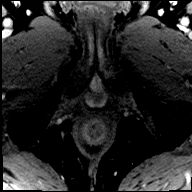

[Series 47: post t1_twist_tra_dyn-copy cent_sub_ttc=(id) · axial · 3.5mm · 0.83mm/px · 1 of 22 slices shown (17 of 22)]
[im 1/22]
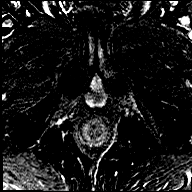

[Series 48: post t1_twist_tra_dyn-copy center · axial · 3.5mm · 0.83mm/px · 1 of 22 slices shown (19 of 24)]
[im 1/22]
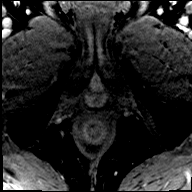

[Series 49: post t1_twist_tra_dyn-copy cent_sub_ttc=(id) · axial · 3.5mm · 0.83mm/px · 1 of 22 slices shown (18 of 22)]
[im 1/22]
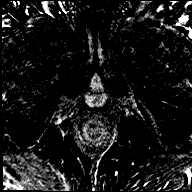

[Series 50: post t1_twist_tra_dyn-copy center · axial · 3.5mm · 0.83mm/px · 1 of 22 slices shown (20 of 24)]
[im 1/22]
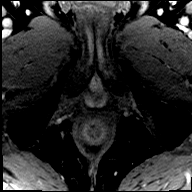

[Series 51: post t1_twist_tra_dyn-copy cent_sub_ttc=(id) · axial · 3.5mm · 0.83mm/px · 1 of 22 slices shown (19 of 22)]
[im 1/22]
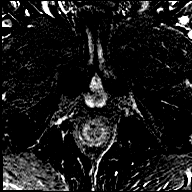

[Series 52: post t1_twist_tra_dyn-copy center · axial · 3.5mm · 0.83mm/px · 1 of 22 slices shown (21 of 24)]
[im 1/22]
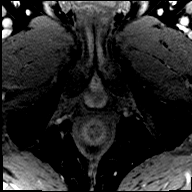

[Series 53: post t1_twist_tra_dyn-copy cent_sub_ttc=(id) · axial · 3.5mm · 0.83mm/px · 1 of 22 slices shown (20 of 22)]
[im 1/22]
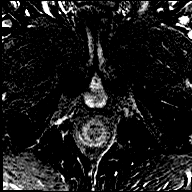

[Series 54: post t1_twist_tra_dyn-copy center · axial · 3.5mm · 0.83mm/px · 1 of 22 slices shown (22 of 24)]
[im 1/22]
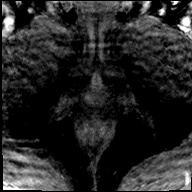

[Series 55: post t1_twist_tra_dyn-copy cent_sub_ttc=(id) · axial · 3.5mm · 0.83mm/px · 1 of 22 slices shown (21 of 22)]
[im 1/22]
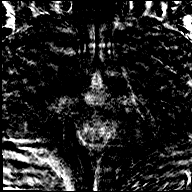

[Series 56: post t1_twist_tra_dyn-copy center · axial · 3.5mm · 0.83mm/px · 1 of 22 slices shown (23 of 24)]
[im 1/22]
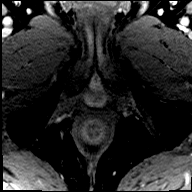

[Series 57: post t1_twist_tra_dyn-copy cent_sub_ttc=(id) · axial · 3.5mm · 0.83mm/px · 1 of 22 slices shown (22 of 22)]
[im 1/22]
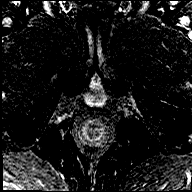

[Series 58: post t1_twist_tra_dyn-copy center · axial · 3.5mm · 0.83mm/px · 1 of 22 slices shown (24 of 24)]
[im 1/22]
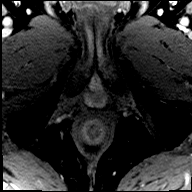

[56 of 56 positions shown; findings below may reference images not displayed]

FINDINGS: Prostate: No findings suspicious for high-grade macroscopic prostate
cancer. Specifically, no focal low T2 lesion within the peripheral
zone. No early arterial enhancement. No restricted diffusion/low
ADC.

Central gland is within normal limits, without suspicious central
gland nodule on T2.

Volume: 2.7 x 3.1 x 4.6 cm (volume = 20 mL)

Transcapsular spread:  Absent.

Seminal vesicle involvement: Absent.

Neurovascular bundle involvement: Absent.

Pelvic adenopathy: Absent.

Bone metastasis: Absent.

Other findings: None.
IMPRESSION: No findings suspicious for high-grade macroscopic prostate cancer on
MRI.
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
## 2020-12-07 ENCOUNTER — Ambulatory Visit: Payer: Medicare Other | Admitting: Urology

## 2020-12-07 DIAGNOSIS — R319 Hematuria, unspecified: Secondary | ICD-10-CM

## 2020-12-07 DIAGNOSIS — R3989 Other symptoms and signs involving the genitourinary system: Secondary | ICD-10-CM

## 2020-12-07 DIAGNOSIS — N138 Other obstructive and reflux uropathy: Secondary | ICD-10-CM

## 2020-12-07 DIAGNOSIS — N3942 Incontinence without sensory awareness: Secondary | ICD-10-CM

## 2020-12-08 ENCOUNTER — Ambulatory Visit: Payer: Medicare Other | Admitting: Urology
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
## 2020-12-20 ENCOUNTER — Encounter: Payer: Self-pay | Admitting: Urology

## 2020-12-20 ENCOUNTER — Other Ambulatory Visit: Payer: Self-pay

## 2020-12-20 ENCOUNTER — Ambulatory Visit (INDEPENDENT_AMBULATORY_CARE_PROVIDER_SITE_OTHER): Payer: Medicare Other | Admitting: Urology

## 2020-12-20 VITALS — BP 126/71 | HR 73 | Ht 73.0 in | Wt 183.8 lb

## 2020-12-20 DIAGNOSIS — N138 Other obstructive and reflux uropathy: Secondary | ICD-10-CM | POA: Diagnosis not present

## 2020-12-20 DIAGNOSIS — N3942 Incontinence without sensory awareness: Secondary | ICD-10-CM | POA: Diagnosis not present

## 2020-12-20 DIAGNOSIS — R3989 Other symptoms and signs involving the genitourinary system: Secondary | ICD-10-CM

## 2020-12-20 DIAGNOSIS — R319 Hematuria, unspecified: Secondary | ICD-10-CM

## 2020-12-20 DIAGNOSIS — N401 Enlarged prostate with lower urinary tract symptoms: Secondary | ICD-10-CM | POA: Diagnosis not present

## 2020-12-20 LAB — BLADDER SCAN AMB NON-IMAGING

## 2020-12-20 MED ORDER — MIRABEGRON ER 50 MG PO TB24: 50.0000 mg | ORAL_TABLET | Freq: Every day | ORAL | 3 refills | Status: DC

## 2020-12-21 LAB — URINALYSIS, COMPLETE
Bilirubin, UA: NEGATIVE
Glucose, UA: NEGATIVE
Nitrite, UA: POSITIVE — AB
Protein,UA: NEGATIVE
RBC, UA: NEGATIVE
Specific Gravity, UA: 1.025 (ref 1.005–1.030)
Urobilinogen, Ur: 2 mg/dL — ABNORMAL HIGH (ref 0.2–1.0)
pH, UA: 6 (ref 5.0–7.5)

## 2020-12-21 LAB — MICROSCOPIC EXAMINATION

## 2020-12-21 LAB — PSA: Prostate Specific Ag, Serum: 1.4 ng/mL (ref 0.0–4.0)
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
## 2021-12-20 ENCOUNTER — Encounter: Payer: Self-pay | Admitting: Urology

## 2021-12-20 ENCOUNTER — Ambulatory Visit (INDEPENDENT_AMBULATORY_CARE_PROVIDER_SITE_OTHER): Payer: Medicare Other | Admitting: Urology

## 2021-12-20 VITALS — BP 117/71 | HR 67 | Ht 70.0 in | Wt 164.0 lb

## 2021-12-20 DIAGNOSIS — R319 Hematuria, unspecified: Secondary | ICD-10-CM | POA: Diagnosis not present

## 2021-12-20 DIAGNOSIS — N138 Other obstructive and reflux uropathy: Secondary | ICD-10-CM | POA: Diagnosis not present

## 2021-12-20 DIAGNOSIS — N3942 Incontinence without sensory awareness: Secondary | ICD-10-CM | POA: Diagnosis not present

## 2021-12-20 DIAGNOSIS — N401 Enlarged prostate with lower urinary tract symptoms: Secondary | ICD-10-CM

## 2021-12-20 LAB — BLADDER SCAN AMB NON-IMAGING

## 2021-12-20 MED ORDER — TAMSULOSIN HCL 0.4 MG PO CAPS: 0.4000 mg | ORAL_CAPSULE | Freq: Every day | ORAL | 0 refills | Status: AC

## 2021-12-20 MED ORDER — MIRABEGRON ER 50 MG PO TB24: 50.0000 mg | ORAL_TABLET | Freq: Every day | ORAL | 0 refills | Status: AC

## 2021-12-20 NOTE — Patient Instructions (Addendum)
I had a discussion with Justin Stevens regarding the AUA Guideline's (07/2021) for men aged 72+ years with PSA < 3 ng/mL may choose discontinue screening.  I explained the reasoning behind this is we want to find individuals who are at risk for a high-grade prostate cancer that could potentially be life-threatening.  He is at a slightly higher risk for prostate cancer due to having an African ancestry.  I did inquire whether or not he had family members who have had prostate cancer, ovarian cancer or breast cancer as that would also be an additional risk factor for prostate cancer.  He has chosen not to continue screening at this time, so I did not obtain the screening blood work (PSA) or do a prostate exam at this visit.    If you are not in agreement with his decision or would like to have further discussion, please feel free to call me at [phone].    Justin Stevens was not able to urinate here in the office and I do need a urine sample.  His staff stated that they would bring a urine sample by later this afternoon.

## 2021-12-21 ENCOUNTER — Other Ambulatory Visit: Payer: Self-pay

## 2021-12-21 ENCOUNTER — Other Ambulatory Visit: Payer: Medicare Other

## 2021-12-21 DIAGNOSIS — N401 Enlarged prostate with lower urinary tract symptoms: Secondary | ICD-10-CM

## 2021-12-22 LAB — URINALYSIS, COMPLETE
Bilirubin, UA: NEGATIVE
Ketones, UA: NEGATIVE
Leukocytes,UA: NEGATIVE
Nitrite, UA: NEGATIVE
Protein,UA: NEGATIVE
RBC, UA: NEGATIVE
Specific Gravity, UA: 1.02 (ref 1.005–1.030)
Urobilinogen, Ur: 0.2 mg/dL (ref 0.2–1.0)
pH, UA: 5 (ref 5.0–7.5)

## 2021-12-22 LAB — MICROSCOPIC EXAMINATION
Epithelial Cells (non renal): NONE SEEN /hpf (ref 0–10)
RBC, Urine: NONE SEEN /hpf (ref 0–2)

## 2022-05-02 ENCOUNTER — Other Ambulatory Visit: Payer: Self-pay

## 2022-05-02 ENCOUNTER — Emergency Department: Payer: Medicare Other

## 2022-05-02 ENCOUNTER — Emergency Department
Admission: EM | Admit: 2022-05-02 | Discharge: 2022-05-02 | Disposition: A | Discharge status: Home / Self Care | Payer: Medicare Other | Attending: Student in an Organized Health Care Education/Training Program | Admitting: Student in an Organized Health Care Education/Training Program

## 2022-05-02 DIAGNOSIS — N3000 Acute cystitis without hematuria: Secondary | ICD-10-CM | POA: Diagnosis not present

## 2022-05-02 DIAGNOSIS — R350 Frequency of micturition: Secondary | ICD-10-CM | POA: Diagnosis present

## 2022-05-02 DIAGNOSIS — R531 Weakness: Secondary | ICD-10-CM | POA: Insufficient documentation

## 2022-05-02 LAB — URINALYSIS, ROUTINE W REFLEX MICROSCOPIC
Bilirubin Urine: NEGATIVE
Glucose, UA: >=500 mg/dL — AB
Ketones, ur: NEGATIVE mg/dL
Nitrite: NEGATIVE
Protein, ur: NEGATIVE mg/dL
Specific Gravity, Urine: 1.008 (ref 1.005–1.030)
pH: 5 (ref 5.0–8.0)

## 2022-05-02 LAB — CBC
HCT: 46.5 % (ref 39.0–52.0)
Hemoglobin: 15.2 g/dL (ref 13.0–17.0)
MCH: 31.1 pg (ref 26.0–34.0)
MCHC: 32.7 g/dL (ref 30.0–36.0)
MCV: 95.1 fL (ref 80.0–100.0)
Platelets: 209 10*3/uL (ref 150–400)
RBC: 4.89 MIL/uL (ref 4.22–5.81)
RDW: 13.2 % (ref 11.5–15.5)
WBC: 5.5 10*3/uL (ref 4.0–10.5)
nRBC: 0 % (ref 0.0–0.2)

## 2022-05-02 LAB — BASIC METABOLIC PANEL
Anion gap: 9 (ref 5–15)
BUN: 15 mg/dL (ref 8–23)
CO2: 26 mmol/L (ref 22–32)
Calcium: 9.1 mg/dL (ref 8.9–10.3)
Chloride: 102 mmol/L (ref 98–111)
Creatinine, Ser: 1 mg/dL (ref 0.61–1.24)
GFR, Estimated: >60 mL/min (ref >60)
Glucose, Bld: 85 mg/dL (ref 70–99)
Potassium: 3.6 mmol/L (ref 3.5–5.1)
Sodium: 137 mmol/L (ref 135–145)

## 2022-05-02 MED ORDER — CEPHALEXIN 500 MG PO CAPS: 500.0000 mg | ORAL_CAPSULE | Freq: Three times a day (TID) | ORAL | 0 refills | Status: AC

## 2022-05-02 MED ORDER — SODIUM CHLORIDE 0.9 % IV SOLN
1.0000 g | Freq: Once | INTRAVENOUS | Status: AC
  Administered 2022-05-02: 1 g via INTRAVENOUS
  Filled 2022-05-02: qty 10

## 2022-05-02 NOTE — ED Triage Notes (Signed)
Pt comes with c/o increased weakness and not himself. Pt has also been sleeping a lot. Pt does have dementia but doesn't seem to be at baseline. Pt states some more frequency with urination.  Pt also has been shaking a lot per staff.

## 2022-05-02 NOTE — ED Notes (Signed)
Group home staff about to leave the bedside, states he has 5 other residents to check up on. Contact info provided to staff. Ward Chatters (group home staff) [phone] Kennith Gain [phone] (group home administrator).

## 2022-05-02 NOTE — ED Provider Notes (Signed)
[facility] Provider Note    Event Date/Time   First MD Initiated Contact with Patient 05/02/22 1536     (approximate)   History   Weakness   HPI  Mishael Haran is a 72 y.o. male presenting from group with caretaker due to some confusion and altered mental status over the past few days.  Last seen normal over a week ago.  Has had some increasing urinary frequency.  Last check for UTI last month.  No recent medication changes.  No new medications.  No reported fever.  No nausea or vomiting.  Normal appetite.       Physical Exam   Triage Vital Signs: ED Triage Vitals  Enc Vitals Group     BP 05/02/22 1417 111/72     Pulse Rate 05/02/22 1417 69     Resp 05/02/22 1417 18     Temp 05/02/22 1417 98 F (36.7 C)     Temp src --      SpO2 05/02/22 1417 96 %     Weight --      Height --      Head Circumference --      Peak Flow --      Pain Score 05/02/22 1415 0     Pain Loc --      Pain Edu? --      Excl. in [place]? --     Most recent vital signs: Vitals:   05/02/22 1700 05/02/22 1705  BP:  120/72  Pulse:    Resp:  14  Temp:    SpO2: 97%      Constitutional: Alert  Eyes: Conjunctivae are normal.  Head: Atraumatic. Nose: No congestion/rhinnorhea. Mouth/Throat: Mucous membranes are moist.   Neck: Painless ROM.  Cardiovascular:   Good peripheral circulation. Respiratory: Normal respiratory effort.  No retractions.  Gastrointestinal: Soft and nontender.  Musculoskeletal:  no deformity Neurologic:  MAE spontaneously. No gross focal neurologic deficits are appreciated. No facial droop Skin:  Skin is warm, dry and intact. No rash noted. Psychiatric: calm and cooperative.    ED Results / Procedures / Treatments   Labs (all labs ordered are listed, but only abnormal results are displayed) Labs Reviewed  URINALYSIS, ROUTINE W REFLEX MICROSCOPIC - Abnormal; Notable for the following components:      Result Value   Color, Urine YELLOW (*)     APPearance HAZY (*)    Glucose, UA >=500 (*)    Hgb urine dipstick MODERATE (*)    Leukocytes,Ua MODERATE (*)    Bacteria, UA FEW (*)    All other components within normal limits  BASIC METABOLIC PANEL  CBC  CBG MONITORING, ED     EKG  ED ECG REPORT I, Merlyn Lot, the attending physician, personally viewed and interpreted this ECG.   Date: 05/02/2022  EKG Time: 14:17  Rate: 70  Rhythm: sinus  Axis: normal  Intervals: normal qt  ST&T Change: no stemi    RADIOLOGY Please see ED Course for my review and interpretation.  I personally reviewed all radiographic images ordered to evaluate for the above acute complaints and reviewed radiology reports and findings.  These findings were personally discussed with the patient.  Please see medical record for radiology report.    PROCEDURES:  Critical Care performed: No  Procedures   MEDICATIONS ORDERED IN ED: Medications  cefTRIAXone (ROCEPHIN) 1 g in sodium chloride 0.9 % 100 mL IVPB (has no administration in time range)  IMPRESSION / MDM / ASSESSMENT AND PLAN / ED COURSE  I reviewed the triage vital signs and the nursing notes.                              Differential diagnosis includes, but is not limited to, Dehydration, sepsis, pna, uti, hypoglycemia, cva, drug effect, withdrawal, encephalitis  Patient presenting to the ER for evaluation of symptoms as described above.  Based on symptoms, risk factors and considered above differential, this presenting complaint could reflect a potentially life-threatening illness therefore the patient will be placed on continuous pulse oximetry and telemetry for monitoring.  Laboratory evaluation will be sent to evaluate for the above complaints.  CT imaging on my review and interpretation without evidence of acute abnormality.  Per radiology report no acute findings.  This does not seem consistent with stroke.  Seems more metabolic on exam.  Will treat blood work as well as  urinalysis.    Clinical Course as of 05/02/22 1813  Wed May 02, 2022  1806 Urinalysis is consistent with UTI.  No sign of sepsis.  Imaging is otherwise reassuring.  Will give dose of Rocephin and on review of previous cultures it was Klebsiella sensitive to Rocephin.  As well as oral agents.  Do believe patient be appropriate for outpatient management. [PR]    Clinical Course User Index [PR] Merlyn Lot, MD       FINAL CLINICAL IMPRESSION(S) / ED DIAGNOSES   Final diagnoses:  Weakness  Acute cystitis without hematuria     Rx / DC Orders   ED Discharge Orders          Ordered    cephALEXin (KEFLEX) 500 MG capsule  3 times daily        05/02/22 1807             Note:  This document was prepared using Dragon voice recognition software and may include unintentional dictation errors.    Merlyn Lot, MD 05/02/22 [phone]

## 2022-05-02 NOTE — ED Notes (Signed)
In and out cath done without diff.  Ua to lab  pt tolerated well

## 2022-05-02 NOTE — ED Notes (Addendum)
Pt with care giver from group home.  Caregiver states pt is here for weakness today.  No n/v/d  pt denies any pain.   Pt has tremors of both hands/ arms.

## 2022-08-14 ENCOUNTER — Other Ambulatory Visit: Payer: Self-pay

## 2022-08-14 ENCOUNTER — Encounter: Payer: Self-pay | Admitting: Emergency Medicine

## 2022-08-14 ENCOUNTER — Emergency Department: Payer: Medicare Other

## 2022-08-14 ENCOUNTER — Emergency Department
Admission: EM | Admit: 2022-08-14 | Discharge: 2022-08-14 | Disposition: A | Discharge status: Home / Self Care | Payer: Medicare Other | Attending: Emergency Medicine | Admitting: Emergency Medicine

## 2022-08-14 DIAGNOSIS — R4182 Altered mental status, unspecified: Secondary | ICD-10-CM | POA: Insufficient documentation

## 2022-08-14 DIAGNOSIS — N390 Urinary tract infection, site not specified: Secondary | ICD-10-CM | POA: Diagnosis not present

## 2022-08-14 LAB — URINALYSIS, ROUTINE W REFLEX MICROSCOPIC
Bilirubin Urine: NEGATIVE
Glucose, UA: >=500 mg/dL — AB
Hgb urine dipstick: NEGATIVE
Ketones, ur: 5 mg/dL — AB
Nitrite: NEGATIVE
Protein, ur: NEGATIVE mg/dL
Specific Gravity, Urine: 1.02 (ref 1.005–1.030)
pH: 5 (ref 5.0–8.0)

## 2022-08-14 LAB — CBC
HCT: 46.6 % (ref 39.0–52.0)
Hemoglobin: 15.1 g/dL (ref 13.0–17.0)
MCH: 31.6 pg (ref 26.0–34.0)
MCHC: 32.4 g/dL (ref 30.0–36.0)
MCV: 97.5 fL (ref 80.0–100.0)
Platelets: 181 10*3/uL (ref 150–400)
RBC: 4.78 MIL/uL (ref 4.22–5.81)
RDW: 13.6 % (ref 11.5–15.5)
WBC: 5.6 10*3/uL (ref 4.0–10.5)
nRBC: 0 % (ref 0.0–0.2)

## 2022-08-14 LAB — BASIC METABOLIC PANEL
Anion gap: 9 (ref 5–15)
BUN: 18 mg/dL (ref 8–23)
CO2: 26 mmol/L (ref 22–32)
Calcium: 9.1 mg/dL (ref 8.9–10.3)
Chloride: 106 mmol/L (ref 98–111)
Creatinine, Ser: 1.09 mg/dL (ref 0.61–1.24)
GFR, Estimated: >60 mL/min (ref >60)
Glucose, Bld: 143 mg/dL — ABNORMAL HIGH (ref 70–99)
Potassium: 3.5 mmol/L (ref 3.5–5.1)
Sodium: 141 mmol/L (ref 135–145)

## 2022-08-14 LAB — ETHANOL: Alcohol, Ethyl (B): <10 mg/dL (ref <10)

## 2022-08-14 LAB — LACTIC ACID, PLASMA: Lactic Acid, Venous: 1.8 mmol/L (ref 0.5–1.9)

## 2022-08-14 MED ORDER — CEPHALEXIN 500 MG PO CAPS
500.0000 mg | ORAL_CAPSULE | Freq: Once | ORAL | Status: DC
  Filled 2022-08-14: qty 1

## 2022-08-14 MED ORDER — CEPHALEXIN 500 MG PO CAPS: 500.0000 mg | ORAL_CAPSULE | Freq: Two times a day (BID) | ORAL | 0 refills | Status: DC

## 2022-08-14 MED ORDER — SODIUM CHLORIDE 0.9 % IV SOLN: Freq: Once | INTRAVENOUS | Status: DC

## 2022-08-14 MED ORDER — SODIUM CHLORIDE 0.9 % IV SOLN: 1.0000 g | Freq: Once | INTRAVENOUS | Status: DC

## 2022-08-14 NOTE — ED Provider Notes (Signed)
[facility] Provider Note    Event Date/Time   First MD Initiated Contact with Patient 08/14/22 1053     (approximate)   History   Altered Mental Status   HPI  Justin Stevens is a 72 y.o. male who per his from group home with altered mental status.  Per caregiver patient is typically active, able to shower himself but can sometimes be a little unsteady.  Today he has been borderline unresponsive.  No injuries or substance abuse reported.  No fevers reported     Physical Exam   Triage Vital Signs: ED Triage Vitals  Enc Vitals Group     BP 08/14/22 0918 (!) 153/78     Pulse Rate 08/14/22 0918 (!) 59     Resp 08/14/22 0918 10     Temp 08/14/22 0920 98 F (36.7 C)     Temp Source 08/14/22 0920 Axillary     SpO2 08/14/22 0918 97 %     Weight 08/14/22 0919 61.2 kg (135 lb)     Height 08/14/22 1049 1.778 m (5\' 10" )     Head Circumference --      Peak Flow --      Pain Score --      Pain Loc --      Pain Edu? --      Excl. in GC? --     Most recent vital signs: Vitals:   08/14/22 0920 08/14/22 1318  BP:  (!) 148/70  Pulse:  60  Resp:  16  Temp: 98 F (36.7 C) 98 F (36.7 C)  SpO2:  98%     General: Arousable, withdraws from pain CV:  Good peripheral perfusion.  Resp:  Normal effort.  Clear to auscultation bilaterally Abd:  No distention.  Mild, nontender Other:  No evidence of traumatic injury   ED Results / Procedures / Treatments   Labs (all labs ordered are listed, but only abnormal results are displayed) Labs Reviewed  BASIC METABOLIC PANEL - Abnormal; Notable for the following components:      Result Value   Glucose, Bld 143 (*)    All other components within normal limits  URINALYSIS, ROUTINE W REFLEX MICROSCOPIC - Abnormal; Notable for the following components:   Color, Urine YELLOW (*)    APPearance HAZY (*)    Glucose, UA >=500 (*)    Ketones, ur 5 (*)    Leukocytes,Ua TRACE (*)    Bacteria, UA MANY (*)    All other  components within normal limits  CULTURE, BLOOD (ROUTINE X 2)  CULTURE, BLOOD (ROUTINE X 2)  CBC  ETHANOL  LACTIC ACID, PLASMA  LACTIC ACID, PLASMA     EKG  ED ECG REPORT I, Jene Every, the attending physician, personally viewed and interpreted this ECG.  Date: 08/14/2022  Rhythm: normal sinus rhythm QRS Axis: normal Intervals: normal ST/T Wave abnormalities: normal Narrative Interpretation: no evidence of acute ischemia    RADIOLOGY Reviewed interpreted by me, no acute abnormality    PROCEDURES:  Critical Care performed:   Procedures   MEDICATIONS ORDERED IN ED: Medications  0.9 %  sodium chloride infusion (has no administration in time range)  cephALEXin (KEFLEX) capsule 500 mg (has no administration in time range)     IMPRESSION / MDM / ASSESSMENT AND PLAN / ED COURSE  I reviewed the triage vital signs and the nursing notes. Patient's presentation is most consistent with acute presentation with potential threat to life or bodily function.  Patient presents with altered mental status as detailed above, differential includes CVA, ICH, electrolyte abnormality, sepsis  Overall patient is afebrile with generally reassuring vital signs.  EKG is unremarkable, normal white blood cell count, afebrile.  Will send for stat CT head, obtain chest x-ray, lactic, blood cultures, urine and reevaluate.  Medical records reviewed, on January 31 of this year patient had similar presentation and turned out to be a urinary tract infection, pending urinalysis ----------------------------------------- 2:46 PM on 08/14/2022 -----------------------------------------   CT head chest x-ray lactic acid are reassuring, urinalysis with some bacteria with 6-10 squamous epithelial cells  Will cover with p.o. antibiotics but at this time patient is ambulating and is back to baseline per nephew, no indication for admission, will arrange for discharge at this time      FINAL  CLINICAL IMPRESSION(S) / ED DIAGNOSES   Final diagnoses:  Altered mental status, unspecified altered mental status type  Lower urinary tract infectious disease     Rx / DC Orders   ED Discharge Orders     None        Note:  This document was prepared using Dragon voice recognition software and may include unintentional dictation errors.   Jene Every, MD 08/14/22 [phone]

## 2022-08-14 NOTE — ED Triage Notes (Signed)
See first nurse note. Pt to ED from local group home. Pt responsive to sternal rub. Pt not answering questions. Hx substance abuse. Ems called out for AMS. Pt reportedly took shower this AM this was not acting self.  2mg  narcan given EMS.  Nephew with pt, reports not normal  18g to R FA from EMs

## 2022-08-14 NOTE — ED Notes (Signed)
This RN attempted to get IV access and blood cultures. RN was able to place 22g to the left arm. RN attempted x3 to the right arm for second set of blood cultures without success. Catheters intact, bleeding controlled.

## 2022-08-14 NOTE — ED Notes (Signed)
See triage note. Presents with staff from group home  States he was alert at 6 am  did take a shower   ate breakfast   Then he became alert  Less responsive

## 2022-08-14 NOTE — ED Notes (Signed)
First nurse note: Pt here via [facility] from local group home. Pt here with c/o of AMS, pt took a shower this am.   CBG: 133  VSS per EMS   2mg  of narcan given with no response

## 2022-08-19 LAB — CULTURE, BLOOD (ROUTINE X 2): Culture: NO GROWTH
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
## 2022-12-19 ENCOUNTER — Encounter: Payer: Self-pay | Admitting: Urology

## 2022-12-19 ENCOUNTER — Ambulatory Visit: Payer: Medicare Other | Admitting: Urology

## 2022-12-19 DIAGNOSIS — N3942 Incontinence without sensory awareness: Secondary | ICD-10-CM

## 2022-12-19 DIAGNOSIS — N138 Other obstructive and reflux uropathy: Secondary | ICD-10-CM

## 2022-12-19 DIAGNOSIS — R3989 Other symptoms and signs involving the genitourinary system: Secondary | ICD-10-CM

## 2022-12-19 DIAGNOSIS — R319 Hematuria, unspecified: Secondary | ICD-10-CM

## 2022-12-21 ENCOUNTER — Ambulatory Visit: Payer: Medicare Other | Admitting: Urology

## 2023-01-28 ENCOUNTER — Telehealth: Payer: Self-pay | Admitting: Urology

## 2023-01-28 NOTE — Telephone Encounter (Signed)
He missed his one year follow up with [place].  It is important to see [place] yearly as he has a history of an abnormal prostate exam.  We need to continue to monitor his PSA and DRE to make sure nothing changes so we do  not miss prostate cancer.

## 2023-02-01 NOTE — Telephone Encounter (Signed)
Tuesday Feb 26, 2023  Appt at 10:30 AM (30 min)    Appt scheduled with group home manager.
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
## 2023-02-26 ENCOUNTER — Ambulatory Visit: Payer: Medicare Other | Admitting: Urology

## 2023-02-26 DIAGNOSIS — N3942 Incontinence without sensory awareness: Secondary | ICD-10-CM

## 2023-02-26 DIAGNOSIS — R319 Hematuria, unspecified: Secondary | ICD-10-CM

## 2023-02-26 DIAGNOSIS — N138 Other obstructive and reflux uropathy: Secondary | ICD-10-CM

## 2023-02-26 DIAGNOSIS — R3989 Other symptoms and signs involving the genitourinary system: Secondary | ICD-10-CM

## 2023-10-30 ENCOUNTER — Other Ambulatory Visit: Payer: Self-pay

## 2023-10-30 ENCOUNTER — Emergency Department
Admission: EM | Admit: 2023-10-30 | Discharge: 2023-10-31 | Disposition: A | Discharge status: Home / Self Care | Attending: Emergency Medicine | Admitting: Emergency Medicine

## 2023-10-30 ENCOUNTER — Emergency Department

## 2023-10-30 DIAGNOSIS — Z79899 Other long term (current) drug therapy: Secondary | ICD-10-CM | POA: Diagnosis not present

## 2023-10-30 DIAGNOSIS — E162 Hypoglycemia, unspecified: Secondary | ICD-10-CM

## 2023-10-30 DIAGNOSIS — R32 Unspecified urinary incontinence: Secondary | ICD-10-CM | POA: Insufficient documentation

## 2023-10-30 DIAGNOSIS — F0390 Unspecified dementia without behavioral disturbance: Secondary | ICD-10-CM | POA: Diagnosis not present

## 2023-10-30 DIAGNOSIS — I700 Atherosclerosis of aorta: Secondary | ICD-10-CM | POA: Insufficient documentation

## 2023-10-30 DIAGNOSIS — E11649 Type 2 diabetes mellitus with hypoglycemia without coma: Secondary | ICD-10-CM | POA: Diagnosis present

## 2023-10-30 DIAGNOSIS — I6782 Cerebral ischemia: Secondary | ICD-10-CM | POA: Insufficient documentation

## 2023-10-30 DIAGNOSIS — E1165 Type 2 diabetes mellitus with hyperglycemia: Secondary | ICD-10-CM | POA: Insufficient documentation

## 2023-10-30 DIAGNOSIS — N390 Urinary tract infection, site not specified: Secondary | ICD-10-CM

## 2023-10-30 DIAGNOSIS — R197 Diarrhea, unspecified: Secondary | ICD-10-CM | POA: Insufficient documentation

## 2023-10-30 LAB — URINALYSIS, W/ REFLEX TO CULTURE (INFECTION SUSPECTED)
Bilirubin Urine: NEGATIVE
Glucose, UA: >=500 mg/dL — AB
Ketones, ur: 5 mg/dL — AB
Nitrite: NEGATIVE
Protein, ur: NEGATIVE mg/dL
Specific Gravity, Urine: >1.046 — ABNORMAL HIGH (ref 1.005–1.030)
WBC, UA: >50 WBC/hpf (ref 0–5)
pH: 5 (ref 5.0–8.0)

## 2023-10-30 LAB — COMPREHENSIVE METABOLIC PANEL WITH GFR
ALT: 14 U/L (ref 0–44)
AST: 19 U/L (ref 15–41)
Albumin: 3.7 g/dL (ref 3.5–5.0)
Alkaline Phosphatase: 59 U/L (ref 38–126)
Anion gap: 11 (ref 5–15)
BUN: 18 mg/dL (ref 8–23)
CO2: 24 mmol/L (ref 22–32)
Calcium: 9.1 mg/dL (ref 8.9–10.3)
Chloride: 103 mmol/L (ref 98–111)
Creatinine, Ser: 0.94 mg/dL (ref 0.61–1.24)
GFR, Estimated: >60 mL/min (ref >60)
Glucose, Bld: 145 mg/dL — ABNORMAL HIGH (ref 70–99)
Potassium: 3.5 mmol/L (ref 3.5–5.1)
Sodium: 138 mmol/L (ref 135–145)
Total Bilirubin: 0.5 mg/dL (ref 0.0–1.2)
Total Protein: 6.6 g/dL (ref 6.5–8.1)

## 2023-10-30 LAB — CBC WITH DIFFERENTIAL/PLATELET
Abs Immature Granulocytes: 0 K/uL (ref 0.00–0.07)
Basophils Absolute: 0 K/uL (ref 0.0–0.1)
Basophils Relative: 0 %
Eosinophils Absolute: 0.1 K/uL (ref 0.0–0.5)
Eosinophils Relative: 1 %
HCT: 42.5 % (ref 39.0–52.0)
Hemoglobin: 14.1 g/dL (ref 13.0–17.0)
Immature Granulocytes: 0 %
Lymphocytes Relative: 18 %
Lymphs Abs: 1.1 K/uL (ref 0.7–4.0)
MCH: 32.4 pg (ref 26.0–34.0)
MCHC: 33.2 g/dL (ref 30.0–36.0)
MCV: 97.7 fL (ref 80.0–100.0)
Monocytes Absolute: 0.7 K/uL (ref 0.1–1.0)
Monocytes Relative: 11 %
Neutro Abs: 4.5 K/uL (ref 1.7–7.7)
Neutrophils Relative %: 70 %
Platelets: 150 K/uL (ref 150–400)
RBC: 4.35 MIL/uL (ref 4.22–5.81)
RDW: 13.5 % (ref 11.5–15.5)
WBC: 6.4 K/uL (ref 4.0–10.5)
nRBC: 0 % (ref 0.0–0.2)

## 2023-10-30 LAB — CBG MONITORING, ED
Glucose-Capillary: 133 mg/dL — ABNORMAL HIGH (ref 70–99)
Glucose-Capillary: 80 mg/dL (ref 70–99)
Glucose-Capillary: 81 mg/dL (ref 70–99)

## 2023-10-30 LAB — TROPONIN I (HIGH SENSITIVITY): Troponin I (High Sensitivity): 5 ng/L (ref <18)

## 2023-10-30 MED ORDER — CEPHALEXIN 500 MG PO CAPS
500.0000 mg | ORAL_CAPSULE | Freq: Once | ORAL | Status: AC
  Administered 2023-10-30: 500 mg via ORAL
  Filled 2023-10-30: qty 1

## 2023-10-30 MED ORDER — LACTATED RINGERS IV BOLUS
1000.0000 mL | Freq: Once | INTRAVENOUS | Status: AC
  Administered 2023-10-30: 1000 mL via INTRAVENOUS

## 2023-10-30 MED ORDER — CEPHALEXIN 500 MG PO CAPS: 500.0000 mg | ORAL_CAPSULE | Freq: Two times a day (BID) | ORAL | 0 refills | Status: DC

## 2023-10-30 MED ORDER — IOHEXOL 300 MG/ML  SOLN
100.0000 mL | Freq: Once | INTRAMUSCULAR | Status: AC | PRN
  Administered 2023-10-30: 100 mL via INTRAVENOUS

## 2023-10-30 NOTE — ED Notes (Signed)
 Called to [employer] Per RN Jeanna @1055  PM/Transport to [employer] #2/Rep [place].

## 2023-10-30 NOTE — ED Provider Notes (Signed)
-----------------------------------------   9:26 PM on 10/30/2023 ----------------------------------------- Patient care assumed from Dr. Waymond.  Patient's urinalysis shows greater than 50 white cells with some rare bacteria.  Will start the patient on antibiotics as a precaution.  Urine culture has been sent.  Workup otherwise reassuring patient's blood glucose has maintained largely in the 80s.  We will discharge back to the patient's care facility.   Dorothyann Drivers, MD 10/30/23 2126

## 2023-10-30 NOTE — ED Triage Notes (Signed)
 Pt arrived via EMS from a Group home called [facility] in [place]. EMS was called from a PCP office due to confusion, weak, diarrhea and AMS that is more then normal. Pt has dementia with MDD. Pt is alert at this time. Per EMS pt BGL was 42, pt was given of D10 via 20g IV in the right wrist.

## 2023-10-30 NOTE — ED Provider Notes (Signed)
 [facility] Provider Note    Event Date/Time   First MD Initiated Contact with Patient 10/30/23 1154     (approximate)   History   Hypoglycemia   HPI  Justin Stevens is a 73 y.o. male history of dementia, urinary incontinence at baseline, diabetes, presenting with hypoglycemia.  Per independent history from EMS, patient is coming from his doctor's office, lives in a group home, has been having a lot of diarrhea, group home and noted that he is more weak and confused than usual.  Is incontinent at baseline.  No fever, no reported falls.  They found him to have a blood glucose of 44, was given D10.  Was reportedly on metformin previously but was taken off of it due to hypoglycemia.  Patient has no complaints, states that he does not any headaches, no pain anywhere, no abdominal pain or urinary symptoms.  No trauma or falls, no pain to his extremities.  No leg swelling.  No shortness of breath.  No vision changes, no new weakness or numbness.  Denies being on any blood thinners.  Independent history history obtained from EMS as above.  EMS also reports mental status is improving after he was given glucose.  On independent chart review, he was seen by podiatry in March, was complaining about forefoot pain at that time, he has no reports of pain to his feet today.  Also seen by neurology in December of last year, has history of dementia on mirtazapine, Aricept, fluphenazine.     Physical Exam   Triage Vital Signs: ED Triage Vitals  Encounter Vitals Group     BP      Girls Systolic BP Percentile      Girls Diastolic BP Percentile      Boys Systolic BP Percentile      Boys Diastolic BP Percentile      Pulse      Resp      Temp      Temp src      SpO2      Weight      Height      Head Circumference      Peak Flow      Pain Score      Pain Loc      Pain Education      Exclude from Growth Chart     Most recent vital signs: There were no vitals filed for  this visit.   General: Awake, no distress.  CV:  Good peripheral perfusion.  Resp:  Normal effort.  Abd:  No distention.  Soft nontender Other:  Pupils are equal reactive, no facial asymmetry, he is ANO x 2, no focal weakness or numbness, no lower extremity edema, dry mucous membranes.   ED Results / Procedures / Treatments   Labs (all labs ordered are listed, but only abnormal results are displayed) Labs Reviewed  COMPREHENSIVE METABOLIC PANEL WITH GFR - Abnormal; Notable for the following components:      Result Value   Glucose, Bld 145 (*)    All other components within normal limits  CBG MONITORING, ED - Abnormal; Notable for the following components:   Glucose-Capillary 133 (*)    All other components within normal limits  GASTROINTESTINAL PANEL BY PCR, STOOL (REPLACES STOOL CULTURE)  C DIFFICILE QUICK SCREEN W PCR REFLEX    CBC WITH DIFFERENTIAL/PLATELET  URINALYSIS, W/ REFLEX TO CULTURE (INFECTION SUSPECTED)  CBG MONITORING, ED  TROPONIN I (HIGH SENSITIVITY)  EKG  EKG shows, EKG shows sinus rhythm, rate 67, normal QRS, normal QT TC, he has some APCs, no obvious ischemic ST elevation, baseline is wandering, T wave flattening in aVL, not significantly compared to prior   RADIOLOGY On my independent interpretation, CT head without intracranial hemorrhage   PROCEDURES:  Critical Care performed: No  Procedures   MEDICATIONS ORDERED IN ED: Medications  lactated ringers  bolus 1,000 mL (0 mLs Intravenous Stopped 10/30/23 1524)  iohexol  (OMNIPAQUE ) 300 MG/ML solution 100 mL (100 mLs Intravenous Contrast Given 10/30/23 1333)     IMPRESSION / MDM / ASSESSMENT AND PLAN / ED COURSE  I reviewed the triage vital signs and the nursing notes.                              Differential diagnosis includes, but is not limited to, hypoglycemia secondary to decreased p.o. intake, dehydration, electrolyte derangements, infection, viral illness, gastroenteritis, colitis,  diverticulitis, did also consider C. difficile but no reported new antibiotics use or hospitalizations recently.  Will get labs, EKG, troponin, chest x-ray, repeat blood glucose, UA, CT.  Given some IV fluids.  Patient's presentation is most consistent with acute presentation with potential threat to life or bodily function.  Independent interpretation of labs and imaging below.  On reassessment patient is alert, talking, appears to be at his baseline.  Repeat glucose is 80 after observation in the emergency department.  UA still pending, signed out pending UA.  Likely able to be discharged back to his facility.  The patient is on the cardiac monitor to evaluate for evidence of arrhythmia and/or significant heart rate changes.   Clinical Course as of 10/30/23 1542  Wed Oct 30, 2023  1410 CT Head Wo Contrast IMPRESSION: 1. No acute intracranial abnormality. 2. Chronic microvascular ischemic changes. 3. Generalized parenchymal volume loss.   [TT]  1410 CT ABDOMEN PELVIS W CONTRAST No acute abnormality seen in the abdomen or pelvis.  [TT]    Clinical Course User Index [TT] Waymond Lorelle Cummins, MD     FINAL CLINICAL IMPRESSION(S) / ED DIAGNOSES   Final diagnoses:  Diarrhea, unspecified type  Hypoglycemia     Rx / DC Orders   ED Discharge Orders     None        Note:  This document was prepared using Dragon voice recognition software and may include unintentional dictation errors.    Waymond Lorelle Cummins, MD 10/30/23 [phone]

## 2023-10-30 NOTE — Discharge Instructions (Addendum)
 Please eat plenty of small meals throughout the day to maintain your blood sugar.  Please take antibiotics as prescribed for the next 7 days.  Please follow-up with your doctor within the next few days for recheck/reevaluation.  Return to the emergency department for any symptom personally concerning to yourself.

## 2023-10-31 LAB — URINE CULTURE: Culture: <10000 — AB

## 2023-11-05 ENCOUNTER — Telehealth: Payer: Self-pay

## 2023-11-05 NOTE — Telephone Encounter (Signed)
 error

## 2023-11-25 NOTE — Progress Notes (Signed)
 CC: Chief Complaint  Patient presents with  . Foot Problem    CNC    Justin Stevens presents for several complaints.  Pain beneath the forefoot on both feet aggravated with activity and walking.  Denies injury or open sore.  Also has thick painful toenails and all of his toes causing pain and pressure in his shoes when walking.   Objective: Plantar prominence of the fifth metatarsal head bilateral with significant pain on palpation of the joint.  Significant fat pad atrophy noted bilateral.  Also some pain on palpation of the left heel.  All 10 toenails are thick, dystrophic, discolored, brittle with subungual debris.  Hallux nails are ingrown along the borders with no signs of any drainage  Assessment: Encounter Diagnoses  Name Primary?  . Capsulitis of foot Yes  . Ingrowing nail   . Dermatophytosis of nail   . Pain in toes of both feet   . Fat pad atrophy of foot     Plan: Pressure relief off of the forefoot areas bilateral as well as the left heel.  Debridement of all 10 toenails in length and thickness sharply using toenail nippers with curette used to smooth the ingrown borders of nails.  Patient will return to clinic as needed.  No orders of the defined types were placed in this encounter.

## 2024-01-17 ENCOUNTER — Other Ambulatory Visit: Payer: Self-pay

## 2024-01-17 ENCOUNTER — Emergency Department

## 2024-01-17 ENCOUNTER — Emergency Department
Admission: EM | Admit: 2024-01-17 | Discharge: 2024-01-17 | Disposition: A | Discharge status: Home / Self Care | Attending: Emergency Medicine | Admitting: Emergency Medicine

## 2024-01-17 DIAGNOSIS — N390 Urinary tract infection, site not specified: Secondary | ICD-10-CM | POA: Insufficient documentation

## 2024-01-17 DIAGNOSIS — F0390 Unspecified dementia without behavioral disturbance: Secondary | ICD-10-CM | POA: Insufficient documentation

## 2024-01-17 DIAGNOSIS — E119 Type 2 diabetes mellitus without complications: Secondary | ICD-10-CM | POA: Diagnosis not present

## 2024-01-17 LAB — URINALYSIS, ROUTINE W REFLEX MICROSCOPIC
Bilirubin Urine: NEGATIVE
Glucose, UA: >=500 mg/dL — AB
Hgb urine dipstick: NEGATIVE
Ketones, ur: NEGATIVE mg/dL
Nitrite: NEGATIVE
Protein, ur: NEGATIVE mg/dL
Specific Gravity, Urine: 1.003 — ABNORMAL LOW (ref 1.005–1.030)
pH: 6 (ref 5.0–8.0)

## 2024-01-17 LAB — CBC
HCT: 44.7 % (ref 39.0–52.0)
Hemoglobin: 14.4 g/dL (ref 13.0–17.0)
MCH: 31.8 pg (ref 26.0–34.0)
MCHC: 32.2 g/dL (ref 30.0–36.0)
MCV: 98.7 fL (ref 80.0–100.0)
Platelets: 184 K/uL (ref 150–400)
RBC: 4.53 MIL/uL (ref 4.22–5.81)
RDW: 13.4 % (ref 11.5–15.5)
WBC: 4.2 K/uL (ref 4.0–10.5)
nRBC: 0 % (ref 0.0–0.2)

## 2024-01-17 LAB — COMPREHENSIVE METABOLIC PANEL WITH GFR
ALT: 25 U/L (ref 0–44)
AST: 24 U/L (ref 15–41)
Albumin: 3.8 g/dL (ref 3.5–5.0)
Alkaline Phosphatase: 91 U/L (ref 38–126)
Anion gap: 10 (ref 5–15)
BUN: 14 mg/dL (ref 8–23)
CO2: 23 mmol/L (ref 22–32)
Calcium: 9.1 mg/dL (ref 8.9–10.3)
Chloride: 105 mmol/L (ref 98–111)
Creatinine, Ser: 0.98 mg/dL (ref 0.61–1.24)
GFR, Estimated: >60 mL/min (ref >60)
Glucose, Bld: 149 mg/dL — ABNORMAL HIGH (ref 70–99)
Potassium: 4.1 mmol/L (ref 3.5–5.1)
Sodium: 138 mmol/L (ref 135–145)
Total Bilirubin: 0.6 mg/dL (ref 0.0–1.2)
Total Protein: 6.9 g/dL (ref 6.5–8.1)

## 2024-01-17 LAB — CK: Total CK: 252 U/L (ref 49–397)

## 2024-01-17 LAB — LACTIC ACID, PLASMA
Lactic Acid, Venous: 2.5 mmol/L (ref 0.5–1.9)
Lactic Acid, Venous: 1.4 mmol/L (ref 0.5–1.9)

## 2024-01-17 LAB — TROPONIN I (HIGH SENSITIVITY): Troponin I (High Sensitivity): 15 ng/L (ref <18)

## 2024-01-17 MED ORDER — CEPHALEXIN 500 MG PO CAPS: 500.0000 mg | ORAL_CAPSULE | Freq: Two times a day (BID) | ORAL | 0 refills | Status: AC

## 2024-01-17 MED ORDER — CEPHALEXIN 500 MG PO CAPS
500.0000 mg | ORAL_CAPSULE | Freq: Once | ORAL | Status: AC
  Administered 2024-01-17: 500 mg via ORAL
  Filled 2024-01-17: qty 1

## 2024-01-17 MED ORDER — LACTATED RINGERS IV BOLUS
1000.0000 mL | Freq: Once | INTRAVENOUS | Status: AC
  Administered 2024-01-17: 1000 mL via INTRAVENOUS
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
## 2024-01-17 NOTE — Discharge Instructions (Signed)
 Justin Stevens likely has a mild urinary tract infection.  He should take the antibiotic as prescribed and finish the full course.  He should return to the emergency department immediately for new, worsening, or recurrent weakness, confusion, agitation, or any other new or worsening symptoms that are concerning.

## 2024-01-17 NOTE — ED Notes (Signed)
 Dr Jacolyn notified of lactic 2.5. orders to be placed as needed

## 2024-01-17 NOTE — ED Triage Notes (Addendum)
 Pt comes via EMS from home with c/o AMS. Pt has strong urine odor present. Pt denies any pain or burning with urination. Pt states he lives at home with his family. Pt states he has been sick a long time. Pt has bruise noted to left eye.

## 2024-01-17 NOTE — ED Notes (Addendum)
 Justin Stevens with [facility] here to assist in answering questions. Justin Stevens reports was reported missing person. This RN contacted [employer]

## 2024-03-01 ENCOUNTER — Emergency Department

## 2024-03-01 ENCOUNTER — Other Ambulatory Visit: Payer: Self-pay

## 2024-03-01 ENCOUNTER — Emergency Department: Admission: EM | Admit: 2024-03-01 | Discharge: 2024-03-02 | Disposition: A | Discharge status: Skilled Nursing Facility

## 2024-03-01 DIAGNOSIS — Z23 Encounter for immunization: Secondary | ICD-10-CM | POA: Insufficient documentation

## 2024-03-01 DIAGNOSIS — Y92410 Unspecified street and highway as the place of occurrence of the external cause: Secondary | ICD-10-CM | POA: Insufficient documentation

## 2024-03-01 DIAGNOSIS — E119 Type 2 diabetes mellitus without complications: Secondary | ICD-10-CM | POA: Insufficient documentation

## 2024-03-01 DIAGNOSIS — W19XXXA Unspecified fall, initial encounter: Secondary | ICD-10-CM | POA: Insufficient documentation

## 2024-03-01 DIAGNOSIS — F0390 Unspecified dementia without behavioral disturbance: Secondary | ICD-10-CM | POA: Diagnosis not present

## 2024-03-01 DIAGNOSIS — S0181XA Laceration without foreign body of other part of head, initial encounter: Secondary | ICD-10-CM | POA: Insufficient documentation

## 2024-03-01 DIAGNOSIS — N3001 Acute cystitis with hematuria: Secondary | ICD-10-CM | POA: Diagnosis not present

## 2024-03-01 DIAGNOSIS — S01112A Laceration without foreign body of left eyelid and periocular area, initial encounter: Secondary | ICD-10-CM | POA: Diagnosis not present

## 2024-03-01 DIAGNOSIS — N3000 Acute cystitis without hematuria: Secondary | ICD-10-CM

## 2024-03-01 DIAGNOSIS — S0592XA Unspecified injury of left eye and orbit, initial encounter: Secondary | ICD-10-CM | POA: Diagnosis present

## 2024-03-01 LAB — CBC WITH DIFFERENTIAL/PLATELET
Abs Immature Granulocytes: 0.02 K/uL (ref 0.00–0.07)
Basophils Absolute: 0 K/uL (ref 0.0–0.1)
Basophils Relative: 0 %
Eosinophils Absolute: 0.1 K/uL (ref 0.0–0.5)
Eosinophils Relative: 1 %
HCT: 45.4 % (ref 39.0–52.0)
Hemoglobin: 14.9 g/dL (ref 13.0–17.0)
Immature Granulocytes: 0 %
Lymphocytes Relative: 13 %
Lymphs Abs: 1 K/uL (ref 0.7–4.0)
MCH: 32.3 pg (ref 26.0–34.0)
MCHC: 32.8 g/dL (ref 30.0–36.0)
MCV: 98.3 fL (ref 80.0–100.0)
Monocytes Absolute: 0.9 K/uL (ref 0.1–1.0)
Monocytes Relative: 11 %
Neutro Abs: 5.9 K/uL (ref 1.7–7.7)
Neutrophils Relative %: 75 %
Platelets: 190 K/uL (ref 150–400)
RBC: 4.62 MIL/uL (ref 4.22–5.81)
RDW: 13.2 % (ref 11.5–15.5)
WBC: 7.8 K/uL (ref 4.0–10.5)
nRBC: 0 % (ref 0.0–0.2)

## 2024-03-01 LAB — URINALYSIS, COMPLETE (UACMP) WITH MICROSCOPIC
Bilirubin Urine: NEGATIVE
Glucose, UA: >=500 mg/dL — AB
Hgb urine dipstick: NEGATIVE
Ketones, ur: 5 mg/dL — AB
Nitrite: NEGATIVE
Protein, ur: NEGATIVE mg/dL
Specific Gravity, Urine: 1.037 — ABNORMAL HIGH (ref 1.005–1.030)
WBC, UA: >50 WBC/hpf (ref 0–5)
pH: 5 (ref 5.0–8.0)

## 2024-03-01 LAB — BASIC METABOLIC PANEL WITH GFR
Anion gap: 9 (ref 5–15)
BUN: 19 mg/dL (ref 8–23)
CO2: 26 mmol/L (ref 22–32)
Calcium: 9 mg/dL (ref 8.9–10.3)
Chloride: 107 mmol/L (ref 98–111)
Creatinine, Ser: 1.19 mg/dL (ref 0.61–1.24)
GFR, Estimated: >60 mL/min (ref >60)
Glucose, Bld: 100 mg/dL — ABNORMAL HIGH (ref 70–99)
Potassium: 4.4 mmol/L (ref 3.5–5.1)
Sodium: 142 mmol/L (ref 135–145)

## 2024-03-01 MED ORDER — CEPHALEXIN 500 MG PO CAPS
500.0000 mg | ORAL_CAPSULE | Freq: Once | ORAL | Status: AC
  Administered 2024-03-01: 500 mg via ORAL
  Filled 2024-03-01: qty 1

## 2024-03-01 MED ORDER — CEPHALEXIN 500 MG PO CAPS: 500.0000 mg | ORAL_CAPSULE | Freq: Three times a day (TID) | ORAL | 0 refills | Status: DC

## 2024-03-01 MED ORDER — CEPHALEXIN 500 MG PO CAPS: 500.0000 mg | ORAL_CAPSULE | Freq: Three times a day (TID) | ORAL | 0 refills | Status: AC

## 2024-03-01 MED ORDER — TETANUS-DIPHTH-ACELL PERTUSSIS 5-2-15.5 LF-MCG/0.5 IM SUSP
0.5000 mL | Freq: Once | INTRAMUSCULAR | Status: AC
  Administered 2024-03-01: 0.5 mL via INTRAMUSCULAR
  Filled 2024-03-01: qty 0.5

## 2024-03-01 MED ORDER — BACITRACIN ZINC 500 UNIT/GM EX OINT
TOPICAL_OINTMENT | Freq: Once | CUTANEOUS | Status: AC
  Administered 2024-03-01: 1 via TOPICAL
  Filled 2024-03-01: qty 0.9

## 2024-03-01 MED ORDER — LIDOCAINE-EPINEPHRINE-TETRACAINE (LET) TOPICAL GEL
3.0000 mL | Freq: Once | TOPICAL | Status: AC
  Administered 2024-03-01: 3 mL via TOPICAL
  Filled 2024-03-01: qty 3

## 2024-03-01 MED ORDER — ACETAMINOPHEN 500 MG PO TABS
1000.0000 mg | ORAL_TABLET | Freq: Once | ORAL | Status: AC
  Administered 2024-03-01: 1000 mg via ORAL
  Filled 2024-03-01: qty 2

## 2024-03-01 NOTE — Discharge Instructions (Addendum)
 Justin Stevens is being treated with another short course of antibiotics.  Urine culture is pending.  His remaining blood work and images are negative at this time.  His facial laceration was repaired with sutures, which should be removed in 3 to 5 days.  Keep the wound clean, dry, and covered.  Give OTC Tylenol  or Motrin as needed for pain relief.  Follow-up with the primary provider for ongoing evaluation.
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
## 2024-03-01 NOTE — ED Notes (Addendum)
 Spoke with Kela Clay, a nurse at [facility], and updated her on pts ED visit up to now. Kela gave phone number [phone] to call when pt is discharged. Koleen stated it is a number to the assisted living facility.

## 2024-03-01 NOTE — ED Notes (Signed)
 Lynwood Samuels, pts nephew and POA as listed in chart, was contacted and is aware pt is in the emergency room. Kela Clay, a nurse at [facility], stated Lynwood is the legal guardian and Lynwood confirmed as well.

## 2024-03-01 NOTE — ED Notes (Signed)
 Lynwood Samuels, pts nephew and POA, was given information on pts discharge

## 2024-03-01 NOTE — ED Notes (Signed)
 Pt brief changed after brief was found to be spoiled. Pt cleaned with cleansing wipes.
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
## 2024-03-01 NOTE — ED Notes (Signed)
 Bed alarm on, fall risk bracelet on, and non skid shoes on.
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
## 2024-03-05 LAB — URINE CULTURE: Culture: >=100000 — AB

## 2024-03-06 NOTE — Consult Note (Incomplete)
 Call group home to tell pt stop keflex 

## 2024-03-06 NOTE — Progress Notes (Signed)
 ED Antimicrobial Stewardship Positive Culture Follow Up   Justin Stevens is an 73 y.o. male who presented to [facility] on 03/01/2024 with a chief complaint of  Chief Complaint  Patient presents with   Fall    Recent Results (from the past 720 hours)  Urine Culture     Status: Abnormal   Collection Time: 03/01/24 10:14 PM   Specimen: Urine, Random  Result Value Ref Range Status   Specimen Description   Final    URINE, RANDOM Performed at [facility], [address] [place], [place] [address]    Special Requests   Final    NONE Performed at [facility], [address] [place], [place] [address]    Culture >=100,000 COLONIES/mL SERRATIA MARCESCENS (A)  Final   Report Status 03/05/2024 FINAL  Final   Organism ID, Bacteria SERRATIA MARCESCENS (A)  Final      Susceptibility   Serratia marcescens - MIC*    CEFEPIME 1 SENSITIVE Sensitive     CEFTRIAXONE  8 RESISTANT Resistant     CIPROFLOXACIN  <=0.06 SENSITIVE Sensitive     GENTAMICIN <=1 SENSITIVE Sensitive     NITROFURANTOIN 256 RESISTANT Resistant     TRIMETH/SULFA <=20 SENSITIVE Sensitive     MEROPENEM <=0.25 SENSITIVE Sensitive     * >=100,000 COLONIES/mL SERRATIA MARCESCENS    [x]  Treated with cephalexin , organism resistant to prescribed antimicrobial  New antibiotic prescription: Bactrim DS BID x3 days  ED Provider: Dr. Nilsa Levander [name] ,[facility] 03/06/2024, 11:09 AM
